# Patient Record
Sex: Female | Born: 1955 | Race: White | Hispanic: No | Marital: Married | Smoking: Current every day smoker
Health system: Southern US, Community
[De-identification: ages and names within clinical notes are randomized; demographics above are authoritative.]

## PROBLEM LIST (undated history)

## (undated) DIAGNOSIS — E7800 Pure hypercholesterolemia, unspecified: Secondary | ICD-10-CM

## (undated) DIAGNOSIS — F32A Depression, unspecified: Secondary | ICD-10-CM

## (undated) DIAGNOSIS — E119 Type 2 diabetes mellitus without complications: Secondary | ICD-10-CM

## (undated) DIAGNOSIS — M797 Fibromyalgia: Secondary | ICD-10-CM

## (undated) DIAGNOSIS — M810 Age-related osteoporosis without current pathological fracture: Secondary | ICD-10-CM

## (undated) DIAGNOSIS — F419 Anxiety disorder, unspecified: Secondary | ICD-10-CM

## (undated) DIAGNOSIS — K219 Gastro-esophageal reflux disease without esophagitis: Secondary | ICD-10-CM

## (undated) DIAGNOSIS — I10 Essential (primary) hypertension: Secondary | ICD-10-CM

## (undated) DIAGNOSIS — K589 Irritable bowel syndrome without diarrhea: Secondary | ICD-10-CM

## (undated) DIAGNOSIS — K2270 Barrett's esophagus without dysplasia: Secondary | ICD-10-CM

## (undated) DIAGNOSIS — F319 Bipolar disorder, unspecified: Secondary | ICD-10-CM

## (undated) DIAGNOSIS — F329 Major depressive disorder, single episode, unspecified: Secondary | ICD-10-CM

## (undated) HISTORY — PX: HERNIA REPAIR: SHX51

## (undated) HISTORY — DX: Anxiety disorder, unspecified: F41.9

## (undated) HISTORY — PX: TOTAL ABDOMINAL HYSTERECTOMY: SHX209

## (undated) HISTORY — PX: CHOLECYSTECTOMY: SHX55

## (undated) HISTORY — DX: Age-related osteoporosis without current pathological fracture: M81.0

## (undated) HISTORY — DX: Barrett's esophagus without dysplasia: K22.70

## (undated) HISTORY — DX: Gastro-esophageal reflux disease without esophagitis: K21.9

---

## 2005-11-22 ENCOUNTER — Ambulatory Visit: Payer: Self-pay | Admitting: Psychiatry

## 2005-11-22 ENCOUNTER — Inpatient Hospital Stay (HOSPITAL_COMMUNITY): Admission: EM | Admit: 2005-11-22 | Discharge: 2005-11-28 | Payer: Self-pay | Admitting: Psychiatry

## 2007-06-20 ENCOUNTER — Ambulatory Visit: Payer: Self-pay | Admitting: *Deleted

## 2007-06-21 ENCOUNTER — Inpatient Hospital Stay (HOSPITAL_COMMUNITY): Admission: AD | Admit: 2007-06-21 | Discharge: 2007-06-28 | Payer: Self-pay | Admitting: *Deleted

## 2010-09-06 NOTE — H&P (Signed)
NAME:  Cassie, Cuevas NO.:  [id number]   MEDICAL RECORD NO.:  [id number]          PATIENT TYPE:  IPS   LOCATION:  0302                          FACILITY:  [facility]   PHYSICIAN:  Jasmine Pang, M.D. DATE OF BIRTH:  [birth year]   DATE OF ADMISSION:  06/21/2007  DATE OF DISCHARGE:                       PSYCHIATRIC ADMISSION ASSESSMENT   HISTORY OF PRESENT ILLNESS:  The patient's chief complaint is a lot of  things have built up.  Her stressors include that she lost her home.  She at the request of her friend, went ahead to her home to stay with  her.  Her friend was giving her about 60 dollars a week for things that  she needed and then the patient was asked to leave after approximately 1  month.  She then went to live with her ex-husband and her daughter.  Her  ex-husband is very ill.  She is now living with her son.  She has begun  to imagine bad things, became suicidal and just wanted to take a bunch  of pills.   PAST PSYCHIATRIC HISTORY:  The patient was here approximately 2 years  ago.  She is a patient of [facility].  She sees Dr.  Letitia Libra.   SOCIAL HISTORY:  She is a 55 year old separated female; still  considered, she states, legally married.  Has been married for 27 years.  Has three adult children.  Her oldest is 50 years of age.  She lives  alone.  She is unemployed.  The patient states that she was living alone  but her current living arrangements are with her son.   Drug history.  The patient smokes.  Denies any alcohol or drug use.   FAMILY HISTORY:  Her mother with depression.  Her father with depression  and her sister has schizophrenia.  Her son has bipolar disorder.   PRIMARY CARE PHYSICIAN:  Dr. Reola Calkins in [place].   PAST MEDICAL HISTORY:  Fibromyalgia, irritable bowel syndrome and non-  insulin-dependent diabetes mellitus.   MEDICATIONS:  The patient lists her meds as Paxil 60 mg daily, Effexor  XR 150 mg daily, Tegretol 100 mg  t.i.d., metformin 1000 mg b.i.d.,  Neurontin 690 mg q.i.d., Ritalin 50 mg at 7 a.m. and 12 noon, Deplin 7.5  mg b.i.d., Vistaril 50 mg t.i.d. p.r.n., Januvia  100 mg daily,  lidocaine patch daily.  The patient gets her medications at [facility].   ALLERGIES:  PENICILLIN.   PHYSICAL EXAMINATION:  The patient was assessed at [facility].  Her physical exam was reviewed.  ER notes state the patient has been  tearful and flat.  She did receive Phenergan and Ativan.  Her  temperature is 97. 2, 90 heart rate, 15 respirations, blood pressure is  111/63.   LABORATORY DATA:  Alcohol level is less than 0.01.  CBC within normal  limits.  Urinalysis is negative.  Urine drug screen was negative.  Urine  pregnancy test was negative.  Her total bili was less than 0.1.   MENTAL STATUS EXAM:  The patient is resting in the bed, middle-aged,  cooperative,  fair eye contact.  She is complaining of some nausea at  this time.  Her speech is clear, normal pace and tone.  Mood is  depressed.  The patient's affect appears flat and sad.  Thought process  endorsing suicidal thoughts but able to contract for safety.  Denies any  hallucinations.  Does not indicate any delusional thinking.  Cognitive  function intact.  Her memory is good.  Judgment and insight is good.  She appears sincere.   DIAGNOSIS:  AXIS I:  Bipolar disorder.  AXIS II:  Deferred.  AXIS III:  Fibromyalgia, irritable bowel syndrome, non-insulin-dependent  diabetes mellitus.  AXIS IV:  Problems with housing, economic issues, medical problems,  possibly other psychosocial problems.  AXIS V:  Current is 35.   PLAN:  Contract for safety.  Stabilize mood and thinking.  Will resume  all her medications.  Case manager will assess her living situation.  The patient is to continue the medication, compliant follow-up at  [facility] for her outpatient services.  Her tentative length of stay is  4-5 days.      Landry Corporal, N.P.       Jasmine Pang, M.D.  Electronically Signed    JO/MEDQ  D:  06/21/2007  T:  06/22/2007  Job:  [id number]

## 2010-09-09 NOTE — Discharge Summary (Signed)
Cassie Cuevas, Cassie Cuevas                ACCOUNT NO.:  [id number]   MEDICAL RECORD NO.:  [id number]          PATIENT TYPE:  IPS   LOCATION:  0506                          FACILITY:  [facility]   PHYSICIAN:  Anselm Jungling, MD  DATE OF BIRTH:  26-May-1955   DATE OF ADMISSION:  11/22/2005  DATE OF DISCHARGE:  11/28/2005                                 DISCHARGE SUMMARY   IDENTIFYING DATA AND REASON FOR ADMISSION:  The patient is a 55 year old  divorced white female admitted due to three weeks of increasing depression  and suicidal ideation.  She had experienced multiple losses and was going  through multiple stressors.  She had recently been placed on a trial of  Cymbalta on an outpatient basis, but reported that she did not feel well on  it.  At the time of admission she denied suicidal ideation but clearly  wanted help and seemed to have difficulty functioning.  Please refer to the  admission note for further details pertaining to the symptoms, circumstances  and history that led to her hospitalization.  She was given an initial Axis  I diagnosis of major depressive disorder recurrent versus adjustment  disorder with depressed mood.   MEDICAL AND LABORATORY:  The patient was medically and physically assessed  by the psychiatric nurse practitioner.  She was continued on metformin 1000  mg b.i.d. for non-insulin-dependent diabetes mellitus.  She was given  Neurontin in doses up to 400 mg t.i.d. to address chronic pain.  She was  continued on lisinopril 20 mg daily for hypertension.  There were no  significant acute medical issues during this brief inpatient psychiatric  stay.   HOSPITAL COURSE:  The patient was admitted to the adult inpatient  psychiatric service.  She presented as a well-nourished, well-developed  adult female of above average intelligence and education.  She was pleasant  and cooperative.  She was not psychotic.  Her mood was depressed with sad  affect, but she denied  suicidal ideation.   Initially, Cymbalta was held, because of the impression that it was not  helpful to the patient.  She was started on a trial of Lamictal 25 mg daily.  Subsequently, it was felt best to restart Cymbalta 30 mg daily.   The patient participated in various therapeutic groups and activities  designed to help her acquire better coping skills, a better understanding of  her underlying disorders and dynamics, and the development of an aftercare  and safety plan.  She was a good participant in the treatment program.   A family session occurred on the sixth hospital day involving the patient  and her adult daughters.  She reported that she was not suicidal and any  longer.  She talked of wanting to improve her relationships with her  daughters.  Her daughters encouraged her to take her medication and stop  trying to control them and their families.  The patient's daughters  indicated their opinion that the patient needed to become more active and  get out of bed.  They all shared their frustrations with each other in  detail.  They reaffirmed their love for one another but discussed the need  to set limits with one another as well.  The patient acknowledged that she  had been extremely irritable recently but apologized for this.  Daughters  stated that they were willing to get the patient to her appointments, but  stated that she needed to communicate with them more.  They discussed  aftercare plans.   The following day, the patient indicated that she felt ready for discharge.  She was less depressed, still absent suicidal ideation.  She felt that she  was benefiting greatly from Neurontin and was tolerating Lamictal.   AFTERCARE:  The patient was to follow-up at [facility] with an  appointment on the day after discharge, that is 11/29/2005.   DISCHARGE MEDICATIONS:  Lisinopril 20 mg daily, metformin 1000 mg b.i.d.,  Neurontin 400 mg q.i.d., Cymbalta 30 mg  daily, and Lamictal 25 mg daily.   DISCHARGE DIAGNOSES:  AXIS I:  Mood disorder not otherwise specified.  AXIS II:  Deferred.  AXIS III:  Non-insulin-dependent diabetes mellitus, neuropathic pain  syndrome, hypertension.  AXIS IV:  Stressors, severe.  AXIS V:  Global Assessment Function on discharge 65.      Anselm Jungling, MD  Electronically Signed     SPB/MEDQ  D:  11/30/2005  T:  11/30/2005  Job:  [id number]
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
## 2011-01-13 LAB — CARBAMAZEPINE LEVEL, TOTAL: Carbamazepine Lvl: 3.2 — ABNORMAL LOW

## 2015-08-17 ENCOUNTER — Emergency Department (HOSPITAL_COMMUNITY)
Admission: EM | Admit: 2015-08-17 | Discharge: 2015-08-17 | Disposition: A | Discharge status: Home / Self Care | Payer: Medicare HMO | Attending: Emergency Medicine | Admitting: Emergency Medicine

## 2015-08-17 ENCOUNTER — Emergency Department (HOSPITAL_COMMUNITY): Payer: Medicare HMO

## 2015-08-17 ENCOUNTER — Encounter (HOSPITAL_COMMUNITY): Payer: Self-pay | Admitting: Emergency Medicine

## 2015-08-17 DIAGNOSIS — R109 Unspecified abdominal pain: Secondary | ICD-10-CM | POA: Diagnosis present

## 2015-08-17 DIAGNOSIS — N281 Cyst of kidney, acquired: Secondary | ICD-10-CM | POA: Diagnosis not present

## 2015-08-17 DIAGNOSIS — Z8719 Personal history of other diseases of the digestive system: Secondary | ICD-10-CM | POA: Insufficient documentation

## 2015-08-17 DIAGNOSIS — I10 Essential (primary) hypertension: Secondary | ICD-10-CM | POA: Insufficient documentation

## 2015-08-17 DIAGNOSIS — Z880 Allergy status to penicillin: Secondary | ICD-10-CM | POA: Diagnosis not present

## 2015-08-17 DIAGNOSIS — E119 Type 2 diabetes mellitus without complications: Secondary | ICD-10-CM | POA: Diagnosis not present

## 2015-08-17 DIAGNOSIS — R1032 Left lower quadrant pain: Secondary | ICD-10-CM

## 2015-08-17 DIAGNOSIS — R112 Nausea with vomiting, unspecified: Secondary | ICD-10-CM | POA: Diagnosis not present

## 2015-08-17 HISTORY — DX: Pure hypercholesterolemia, unspecified: E78.00

## 2015-08-17 HISTORY — DX: Type 2 diabetes mellitus without complications: E11.9

## 2015-08-17 HISTORY — DX: Irritable bowel syndrome, unspecified: K58.9

## 2015-08-17 HISTORY — DX: Essential (primary) hypertension: I10

## 2015-08-17 LAB — CBC
HCT: 40.4 % (ref 36.0–46.0)
Hemoglobin: 13.7 g/dL (ref 12.0–15.0)
MCH: 32.8 pg (ref 26.0–34.0)
MCHC: 33.9 g/dL (ref 30.0–36.0)
MCV: 96.7 fL (ref 78.0–100.0)
PLATELETS: 258 10*3/uL (ref 150–400)
RBC: 4.18 MIL/uL (ref 3.87–5.11)
RDW: 15.1 % (ref 11.5–15.5)
WBC: 13 10*3/uL — ABNORMAL HIGH (ref 4.0–10.5)

## 2015-08-17 LAB — URINALYSIS, ROUTINE W REFLEX MICROSCOPIC
BILIRUBIN URINE: NEGATIVE
Glucose, UA: NEGATIVE mg/dL
HGB URINE DIPSTICK: NEGATIVE
KETONES UR: NEGATIVE mg/dL
Nitrite: NEGATIVE
PROTEIN: NEGATIVE mg/dL
Specific Gravity, Urine: 1.016 (ref 1.005–1.030)
pH: 6 (ref 5.0–8.0)

## 2015-08-17 LAB — URINE MICROSCOPIC-ADD ON: RBC / HPF: NONE SEEN RBC/hpf (ref 0–5)

## 2015-08-17 LAB — COMPREHENSIVE METABOLIC PANEL
ALK PHOS: 82 U/L (ref 38–126)
ALT: 20 U/L (ref 14–54)
AST: 28 U/L (ref 15–41)
Albumin: 4 g/dL (ref 3.5–5.0)
Anion gap: 12 (ref 5–15)
BUN: 12 mg/dL (ref 6–20)
CALCIUM: 9.4 mg/dL (ref 8.9–10.3)
CO2: 23 mmol/L (ref 22–32)
CREATININE: 0.78 mg/dL (ref 0.44–1.00)
Chloride: 101 mmol/L (ref 101–111)
Glucose, Bld: 241 mg/dL — ABNORMAL HIGH (ref 65–99)
Potassium: 4.2 mmol/L (ref 3.5–5.1)
SODIUM: 136 mmol/L (ref 135–145)
Total Bilirubin: 0.5 mg/dL (ref 0.3–1.2)
Total Protein: 7.4 g/dL (ref 6.5–8.1)

## 2015-08-17 LAB — I-STAT CG4 LACTIC ACID, ED: Lactic Acid, Venous: 1 mmol/L (ref 0.5–2.0)

## 2015-08-17 LAB — CBG MONITORING, ED: GLUCOSE-CAPILLARY: 180 mg/dL — AB (ref 65–99)

## 2015-08-17 LAB — LIPASE, BLOOD: Lipase: 36 U/L (ref 11–51)

## 2015-08-17 MED ORDER — HYDROMORPHONE HCL 1 MG/ML IJ SOLN
1.0000 mg | Freq: Once | INTRAMUSCULAR | Status: AC
  Administered 2015-08-17: 1 mg via INTRAVENOUS
  Filled 2015-08-17: qty 1

## 2015-08-17 MED ORDER — ONDANSETRON 4 MG PO TBDP
ORAL_TABLET | ORAL | Status: AC
  Filled 2015-08-17: qty 1

## 2015-08-17 MED ORDER — ONDANSETRON 4 MG PO TBDP: 4.0000 mg | ORAL_TABLET | Freq: Three times a day (TID) | ORAL | Status: DC | PRN

## 2015-08-17 MED ORDER — ONDANSETRON HCL 4 MG/2ML IJ SOLN
4.0000 mg | Freq: Once | INTRAMUSCULAR | Status: AC
  Administered 2015-08-17: 4 mg via INTRAVENOUS
  Filled 2015-08-17: qty 2

## 2015-08-17 MED ORDER — IOPAMIDOL (ISOVUE-300) INJECTION 61%
INTRAVENOUS | Status: AC
  Administered 2015-08-17: 100 mL
  Filled 2015-08-17: qty 100

## 2015-08-17 MED ORDER — SODIUM CHLORIDE 0.9 % IV BOLUS (SEPSIS)
1000.0000 mL | Freq: Once | INTRAVENOUS | Status: AC
  Administered 2015-08-17: 1000 mL via INTRAVENOUS

## 2015-08-17 MED ORDER — NAPROXEN 375 MG PO TABS: 375.0000 mg | ORAL_TABLET | Freq: Two times a day (BID) | ORAL | Status: DC | PRN

## 2015-08-17 MED ORDER — ONDANSETRON 4 MG PO TBDP: 4.0000 mg | ORAL_TABLET | Freq: Once | ORAL | Status: DC | PRN

## 2015-08-17 NOTE — ED Notes (Signed)
Back from CT

## 2015-08-17 NOTE — ED Provider Notes (Signed)
CSN: [id number]     Arrival date & time 08/17/15  1456 History   First MD Initiated Contact with Patient 08/17/15 1953     Chief Complaint  Patient presents with  . Abdominal Pain  . Emesis     (Consider location/radiation/quality/duration/timing/severity/associated sxs/prior Treatment) HPI   60 year old female with past medical history of hypertension, diabetes, hyperlipidemia, irritable bowel syndrome, and history of left inguinal hernia repair who presents with acute on chronic left lower quadrant abdominal pain and left inguinal pain. The patient states that when she first had her inguinal hernia repaired last year she had severe left inguinal pain and required rehospitalization 2 days later due to inability to tolerate by mouth. She has had intermittent pain since then. Over the last 2 weeks, she has had increasingly severe left inguinal pain as well as left lower quadrant and left flank pain. The pain is constant but made worse with eating as well as any palpation or movement. The pain has become so severe that she's been unable tolerate her by mouth intake. She denies any associated fevers. She denies any dysuria or urinary frequency. Denies any vaginal bleeding or discharge. Denies any alleviating factors.  Past Medical History  Diagnosis Date  . Diabetes mellitus without complication ([place])   . Hypertension   . IBS (irritable bowel syndrome)   . Hypercholesterolemia    History reviewed. No pertinent past surgical history. History reviewed. No pertinent family history. Social History  Substance Use Topics  . Smoking status: Current Every Day Smoker -- 1.00 packs/day    Types: Cigarettes  . Smokeless tobacco: None  . Alcohol Use: No   OB History    No data available     Review of Systems  Constitutional: Positive for fatigue. Negative for fever and chills.  HENT: Negative for congestion and rhinorrhea.   Eyes: Negative for visual disturbance.  Respiratory: Negative for  cough, shortness of breath and wheezing.   Cardiovascular: Negative for chest pain and leg swelling.  Gastrointestinal: Positive for nausea, vomiting, abdominal pain and diarrhea.  Genitourinary: Negative for dysuria and flank pain.  Musculoskeletal: Negative for neck pain.  Skin: Negative for rash.  Neurological: Negative for syncope, weakness and headaches.      Allergies  Penicillins and Tape  Home Medications   Prior to Admission medications   Not on File   BP 118/78 mmHg  Pulse 100  Temp(Src) 97.9 F (36.6 C) (Oral)  Resp 18  SpO2 99% Physical Exam  Constitutional: She is oriented to person, place, and time. She appears well-developed and well-nourished. No distress.  HENT:  Head: Normocephalic.  Mouth/Throat: No oropharyngeal exudate.  Eyes: Conjunctivae are normal. Pupils are equal, round, and reactive to light.  Neck: Normal range of motion. Neck supple.  Cardiovascular: Normal rate, regular rhythm and intact distal pulses.  Exam reveals no friction rub.   No murmur heard. Pulmonary/Chest: Effort normal and breath sounds normal. No respiratory distress. She has no wheezes. She has no rales.  Abdominal: Soft. She exhibits no distension. There is tenderness. There is no rebound and no guarding.  Surgical sites to periumbilical and left abdominal sites clean dry and intact and well-healed. Moderate tenderness in the left inguinal area with no appreciable hernia. Bowel sounds are normal. She has mild tenderness along the left lower quadrant extending into the left flank as well. No CVA tenderness.  Musculoskeletal: She exhibits no edema.  Neurological: She is alert and oriented to person, place, and time. She exhibits normal  muscle tone.  Skin: Skin is warm. No rash noted.  Nursing note and vitals reviewed.   ED Course  Procedures (including critical care time) Labs Review Labs Reviewed  COMPREHENSIVE METABOLIC PANEL - Abnormal; Notable for the following:     Glucose, Bld 241 (*)    All other components within normal limits  CBC - Abnormal; Notable for the following:    WBC 13.0 (*)    All other components within normal limits  CBG MONITORING, ED - Abnormal; Notable for the following:    Glucose-Capillary 180 (*)    All other components within normal limits  LIPASE, BLOOD  URINALYSIS, ROUTINE W REFLEX MICROSCOPIC (NOT AT [facility])    Imaging Review No results found. I have personally reviewed and evaluated these images and lab results as part of my medical decision-making.   EKG Interpretation None      MDM   60 yo F with PMHx of HTN, HLD, IBS, h/o left inguinal hernia repair who p/w acute on chronic left inguinal pain. VSS and WNL, exam reassuring as above. Initial DDx includes post-op complication, fat herniation, diverticulitis, colitis. No urinary frequency, dysuria, vaginal bleeding, discharge, or other signs of GU etiology. No skin rash or signs of cellulitis. Will check labs, CT, give pain control. She is otherwise well-appearing with no signs of peritonitis, obstruction, or other complication.  Labs and imaging reviewed as above. CBC shows mild leukocytosis. CMP is at baseline with mild hyperglycemia but no anion gap. Urinalysis is unremarkable. Lactic acid is normal and reassuring. CT is pending. Pain is improved with IV fluids and analgesia.  CT scan shows no acute abnormality. Patient remains hemodynamically stable. Pain is improved and she is tolerating by mouth. Will discharge with antibiotics and outpatient follow-up. Patient is in agreement with this plan. She has pain medications at home.  Clinical Impression: 1. Abdominal pain, LLQ   2. Renal cyst, right   3. Non-intractable vomiting with nausea, vomiting of unspecified type     Disposition: Discharge  Condition: Good  I have discussed the results, Dx and Tx plan with the pt(& family if present). He/she/they expressed understanding and agree(s) with the plan. Discharge  instructions discussed at great length. Strict return precautions discussed and pt &/or family have verbalized understanding of the instructions. No further questions at time of discharge.    New Prescriptions   NAPROXEN (NAPROSYN) 375 MG TABLET    Take 1 tablet (375 mg total) by mouth 2 (two) times daily as needed for moderate pain.   ONDANSETRON (ZOFRAN ODT) 4 MG DISINTEGRATING TABLET    Take 1 tablet (4 mg total) by mouth every 8 (eight) hours as needed for nausea or vomiting.    Follow Up: [place] [address]  [place] [address] [phone]   Follow-up as needed  [address] [place] [address] [phone]   Call this number to set up an appointment with one of our surgeons to discuss your postoperative pain.   Pt seen in conjunction with Dr. Everitt Amber, MD 08/17/15 2351  Ezequiel Essex, MD 08/18/15 0001

## 2015-08-17 NOTE — ED Notes (Signed)
Pt reports hx of hernia. Reports increased abdominal pain and vomiting over the last week. Denies recent fever.

## 2015-10-23 ENCOUNTER — Emergency Department (HOSPITAL_COMMUNITY): Payer: Medicare HMO

## 2015-10-23 ENCOUNTER — Emergency Department (HOSPITAL_COMMUNITY)
Admission: EM | Admit: 2015-10-23 | Discharge: 2015-10-23 | Disposition: A | Discharge status: Home / Self Care | Payer: Medicare HMO | Attending: Emergency Medicine | Admitting: Emergency Medicine

## 2015-10-23 ENCOUNTER — Encounter (HOSPITAL_COMMUNITY): Payer: Self-pay

## 2015-10-23 DIAGNOSIS — I10 Essential (primary) hypertension: Secondary | ICD-10-CM | POA: Insufficient documentation

## 2015-10-23 DIAGNOSIS — R531 Weakness: Secondary | ICD-10-CM | POA: Diagnosis present

## 2015-10-23 DIAGNOSIS — F17210 Nicotine dependence, cigarettes, uncomplicated: Secondary | ICD-10-CM | POA: Diagnosis not present

## 2015-10-23 DIAGNOSIS — E119 Type 2 diabetes mellitus without complications: Secondary | ICD-10-CM | POA: Insufficient documentation

## 2015-10-23 DIAGNOSIS — R4781 Slurred speech: Secondary | ICD-10-CM | POA: Diagnosis not present

## 2015-10-23 DIAGNOSIS — Z794 Long term (current) use of insulin: Secondary | ICD-10-CM | POA: Diagnosis not present

## 2015-10-23 DIAGNOSIS — Z79899 Other long term (current) drug therapy: Secondary | ICD-10-CM | POA: Diagnosis not present

## 2015-10-23 DIAGNOSIS — I639 Cerebral infarction, unspecified: Secondary | ICD-10-CM

## 2015-10-23 LAB — CBC WITH DIFFERENTIAL/PLATELET
BASOS ABS: 0 10*3/uL (ref 0.0–0.1)
BASOS PCT: 0 %
EOS ABS: 0.2 10*3/uL (ref 0.0–0.7)
Eosinophils Relative: 2 %
HEMATOCRIT: 36.4 % (ref 36.0–46.0)
HEMOGLOBIN: 12.3 g/dL (ref 12.0–15.0)
Lymphocytes Relative: 28 %
Lymphs Abs: 2.3 10*3/uL (ref 0.7–4.0)
MCH: 33 pg (ref 26.0–34.0)
MCHC: 33.8 g/dL (ref 30.0–36.0)
MCV: 97.6 fL (ref 78.0–100.0)
Monocytes Absolute: 0.5 10*3/uL (ref 0.1–1.0)
Monocytes Relative: 6 %
NEUTROS ABS: 5.3 10*3/uL (ref 1.7–7.7)
NEUTROS PCT: 64 %
Platelets: 220 10*3/uL (ref 150–400)
RBC: 3.73 MIL/uL — ABNORMAL LOW (ref 3.87–5.11)
RDW: 14.2 % (ref 11.5–15.5)
WBC: 8.4 10*3/uL (ref 4.0–10.5)

## 2015-10-23 LAB — BASIC METABOLIC PANEL
ANION GAP: 9 (ref 5–15)
BUN: 20 mg/dL (ref 6–20)
CALCIUM: 9.3 mg/dL (ref 8.9–10.3)
CO2: 26 mmol/L (ref 22–32)
Chloride: 103 mmol/L (ref 101–111)
Creatinine, Ser: 1.03 mg/dL — ABNORMAL HIGH (ref 0.44–1.00)
GFR, EST NON AFRICAN AMERICAN: 59 mL/min — AB (ref >60)
Glucose, Bld: 271 mg/dL — ABNORMAL HIGH (ref 65–99)
Potassium: 4.2 mmol/L (ref 3.5–5.1)
SODIUM: 138 mmol/L (ref 135–145)

## 2015-10-23 LAB — URINALYSIS, ROUTINE W REFLEX MICROSCOPIC
Bilirubin Urine: NEGATIVE
Glucose, UA: 100 mg/dL — AB
Hgb urine dipstick: NEGATIVE
KETONES UR: NEGATIVE mg/dL
LEUKOCYTES UA: NEGATIVE
NITRITE: NEGATIVE
PH: 5 (ref 5.0–8.0)
Protein, ur: NEGATIVE mg/dL
SPECIFIC GRAVITY, URINE: 1.02 (ref 1.005–1.030)

## 2015-10-23 LAB — RAPID URINE DRUG SCREEN, HOSP PERFORMED
Amphetamines: NOT DETECTED
BARBITURATES: NOT DETECTED
BENZODIAZEPINES: POSITIVE — AB
COCAINE: NOT DETECTED
OPIATES: NOT DETECTED
TETRAHYDROCANNABINOL: NOT DETECTED

## 2015-10-23 MED ORDER — SODIUM CHLORIDE 0.9 % IV BOLUS (SEPSIS)
1000.0000 mL | Freq: Once | INTRAVENOUS | Status: AC
  Administered 2015-10-23: 1000 mL via INTRAVENOUS

## 2015-10-23 NOTE — ED Notes (Signed)
Pt transported to MRI 

## 2015-10-23 NOTE — ED Notes (Signed)
Per EMS - pt from home. Pt woke up around 0900 and c/o right arm weakness/tremor. Pt still has slight right arm weakness. Able to follow commands, ambulatory, no facial droop, equal grip strength. CBG 304. Pt took 10mg  valium at 0930.

## 2015-10-23 NOTE — ED Notes (Signed)
Pt ambulatory w/ steady gait to restroom. 

## 2015-10-23 NOTE — Discharge Instructions (Signed)

## 2015-10-23 NOTE — ED Notes (Signed)
[place] for pt to have PO food/fluids per neurologist. Pt given sandwich and water after passing swallow screen.

## 2015-10-23 NOTE — ED Provider Notes (Signed)
Care transferred from [facility].  Pt presents with RUE weakness over the past day.  Seen by neurology and recommend MRI.    MRI found to be negative.  Per checkout pt stable for discharge with f/u with PCP and neurology.  Pt reevaluated and agree with stability for discharge home.   Pt care supervised by my attending Dr. Kathrynn Humble.   Geronimo Boot, MD PGY-3  Emergency Medicine   Geronimo Boot, MD 10/23/15 [phone]

## 2015-10-23 NOTE — Consult Note (Signed)
NEURO HOSPITALIST CONSULT NOTE   Requestig physician: ER   Reason for Consult:Right arm numbness and weakness   History obtained from:  Patient     HPI:                                                                                                                                          Cassie Cuevas is an 60 y.o. female who woke up this am at 5 am normal and went back to sleep on her recliner.  She woke up at 9 am with right arm weakness and numbness.  No face or leg involvement.  She does take Clonazepam for anxiety but has not taken any since last night at 8 pm.  CT brain is normal.  Past Medical History  Diagnosis Date  . Diabetes mellitus without complication ([place])   . Hypertension   . IBS (irritable bowel syndrome)   . Hypercholesterolemia     History reviewed. No pertinent past surgical history.  History reviewed. No pertinent family history.  Social History:  reports that she has been smoking Cigarettes.  She has been smoking about 1.00 pack per day. She does not have any smokeless tobacco history on file. She reports that she does not drink alcohol or use illicit drugs.  Allergies  Allergen Reactions  . Penicillins     Vomiting   . Tape     Rash, needs paper tape     MEDICATIONS:                                                                                                                     Clonazepam qd..   ROS:  History obtained from the patient  General ROS: negative for - chills, fatigue, fever, night sweats, weight gain or weight loss Psychological ROS: negative for - behavioral disorder, hallucinations, memory difficulties, mood swings or suicidal ideation Ophthalmic ROS: negative for - blurry vision, double vision, eye pain or loss of vision ENT ROS: negative for - epistaxis, nasal discharge,  oral lesions, sore throat, tinnitus or vertigo Allergy and Immunology ROS: negative for - hives or itchy/watery eyes Hematological and Lymphatic ROS: negative for - bleeding problems, bruising or swollen lymph nodes Endocrine ROS: negative for - galactorrhea, hair pattern changes, polydipsia/polyuria or temperature intolerance Respiratory ROS: negative for - cough, hemoptysis, shortness of breath or wheezing Cardiovascular ROS: negative for - chest pain, dyspnea on exertion, edema or irregular heartbeat Gastrointestinal ROS: negative for - abdominal pain, diarrhea, hematemesis, nausea/vomiting or stool incontinence Genito-Urinary ROS: negative for - dysuria, hematuria, incontinence or urinary frequency/urgency Musculoskeletal ROS: negative for - joint swelling or muscular weakness Neurological ROS: as noted in HPI Dermatological ROS: negative for rash and skin lesion changes   Blood pressure 122/86, pulse 85, temperature 98.1 F (36.7 C), temperature source Oral, resp. rate 17, height 5\' 3"  (1.6 m), weight 66.225 kg (146 lb), SpO2 98 %.   Neurologic Examination:                                                                                                      HEENT-  Normocephalic, no lesions, without obvious abnormality.  Normal external eye and conjunctiva.  Normal TM's bilaterally.  Normal auditory canals and external ears. Normal external nose, mucus membranes and septum.  Normal pharynx. Cardiovascular- regular rate and rhythm, S1, S2 normal, no murmur, click, rub or gallop, pulses palpable throughout   Lungs- chest clear, no wheezing, rales, normal symmetric air entry, Heart exam - S1, S2 normal, no murmur, no gallop, rate regular Abdomen- soft, non-tender; bowel sounds normal; no masses,  no organomegaly Extremities- no edema Lymph-no adenopathy palpable Musculoskeletal-no joint tenderness, deformity or swelling Skin-dry  Neurological Examination Mental Status: Alert, oriented,  thought content appropriate.  Speech fluent without evidence of aphasia.  Able to follow 3 step commands without difficulty. Cranial Nerves: II: Discs flat bilaterally; Visual fields grossly normal, pupils equal, round, reactive to light and accommodation III,IV, VI: ptosis not present, extra-ocular motions intact bilaterally V,VII: smile symmetric, facial light touch sensation normal bilaterally VIII: hearing normal bilaterally IX,X: uvula rises symmetrically XI: bilateral shoulder shrug XII: midline tongue extension Motor: Right : Upper extremity   2/5 wrist extension and finger extension; Ulnar and Median nerve territory is normal   Left:     Upper extremity   5/5  Lower extremity   5/5     Lower extremity   5/5 Tone and bulk:normal tone throughout; no atrophy noted Sensory: Pinprick and light touch intact throughout, bilaterally, except reduced in the right dorsal forearm and lateral dorsal hand Deep Tendon Reflexes: 2+ and symmetric throughout Plantars: Right: downgoing   Left: downgoing Cerebellar: normal finger-to-nose, normal rapid alternating movements and normal heel-to-shin test Gait: normal gait and station  Lab Results: Basic Metabolic Panel:  Recent Labs Lab 10/23/15 1136  NA 138  K 4.2  CL 103  CO2 26  GLUCOSE 271*  BUN 20  CREATININE 1.03*  CALCIUM 9.3    Liver Function Tests: No results for input(s): AST, ALT, ALKPHOS, BILITOT, PROT, ALBUMIN in the last 168 hours. No results for input(s): LIPASE, AMYLASE in the last 168 hours. No results for input(s): AMMONIA in the last 168 hours.  CBC:  Recent Labs Lab 10/23/15 1136  WBC 8.4  NEUTROABS 5.3  HGB 12.3  HCT 36.4  MCV 97.6  PLT 220    Cardiac Enzymes: No results for input(s): CKTOTAL, CKMB, CKMBINDEX, TROPONINI in the last 168 hours.  Lipid Panel: No results for input(s): CHOL, TRIG, HDL, CHOLHDL, VLDL, LDLCALC in the last 168 hours.  CBG: No results for input(s): GLUCAP in the last  168 hours.  Microbiology: No results found for this or any previous visit.  Coagulation Studies: No results for input(s): LABPROT, INR in the last 72 hours.  Imaging: Ct Head Wo Contrast  10/23/2015  CLINICAL DATA:  60 year old female with right arm weakness and ataxia EXAM: CT HEAD WITHOUT CONTRAST TECHNIQUE: Contiguous axial images were obtained from the base of the skull through the vertex without intravenous contrast. COMPARISON:  None. FINDINGS: Negative for acute intracranial hemorrhage, acute infarction, mass, mass effect, hydrocephalus or midline shift. Gray-white differentiation is preserved throughout. No acute soft tissue or calvarial abnormality. The globes and orbits are symmetric and unremarkable. Normal aeration of the mastoid air cells and visualized paranasal sinuses. IMPRESSION: Negative head CT. Electronically Signed   By: Jacqulynn Cadet M.D.   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  Since she fell asleep on the recliner, it is a set up for that possibly.  This is usually demyelinating and corrects itself over the next few days.   If not better by 3 weeks, then an EMG/NCS needs to be done.  I cannot completely rule out a subcortical left hemisphere infarct which can give isolated right arm weakness.  Therefore, she should have an MRI Brain to rule that out before discharge.  No need to admit unless infarct is present, at which point risk factor assessment and modification needs to be done.

## 2015-10-23 NOTE — ED Notes (Signed)
Dr. Canary Brim and [place], [place] aware of pt presentation of right arm weakness, decreased sensory in right leg, and ataxia in right arm. Uncertain as to LKW because pt woke up with symptoms.

## 2015-10-23 NOTE — ED Notes (Signed)
Neurologist at bedside. 

## 2015-10-23 NOTE — ED Notes (Signed)
Dinner tray ordered for patient, regular diet

## 2015-10-23 NOTE — ED Notes (Signed)
Pt transported to CT ?

## 2015-10-23 NOTE — ED Provider Notes (Signed)
CSN: [id number]     Arrival date & time 10/23/15  1104 History   First MD Initiated Contact with Patient 10/23/15 1113     Chief Complaint  Patient presents with  . Extremity Weakness     (Consider location/radiation/quality/duration/timing/severity/associated sxs/prior Treatment) HPI Cassie Cuevas is a 61 y.o. female history of diabetes, hypertension, hypercholesterolemia, presents for evaluation of right arm weakness. Patient reports she woke up this morning at approximately 9:00 AM and felt like her right arm was more weak than normal. She is very drowsy upon my evaluation, falls asleep, but awakens to stimuli. Reports she took 10 mg of Valium this morning which is normal for her. Denies any associated headache, vision changes, difficulty speaking, facial droop, chest pain or shortness of breath.  Past Medical History  Diagnosis Date  . Diabetes mellitus without complication ([place])   . Hypertension   . IBS (irritable bowel syndrome)   . Hypercholesterolemia    History reviewed. No pertinent past surgical history. History reviewed. No pertinent family history. Social History  Substance Use Topics  . Smoking status: Current Every Day Smoker -- 1.00 packs/day    Types: Cigarettes  . Smokeless tobacco: None  . Alcohol Use: No   OB History    No data available     Review of Systems A 10 point review of systems was completed and was negative except for pertinent positives and negatives as mentioned in the history of present illness     Allergies  Penicillins and Tape  Home Medications   Prior to Admission medications   Medication Sig Start Date End Date Taking? Authorizing Provider  ARIPiprazole (ABILIFY) 10 MG tablet Take 1 tablet by mouth daily. 07/27/15   Historical Provider, MD  buPROPion (WELLBUTRIN SR) 100 MG 12 hr tablet Take 1 tablet by mouth 2 (two) times daily. 07/15/15   Historical Provider, MD  diazepam (VALIUM) 10 MG tablet Take 1 tablet by mouth daily. 07/28/15    Historical Provider, MD  dicyclomine (BENTYL) 10 MG capsule Take 1 capsule by mouth 3 (three) times daily. 08/13/15   Historical Provider, MD  esomeprazole (NEXIUM) 40 MG capsule Take 1 capsule by mouth daily. 08/13/15   Historical Provider, MD  fluticasone (FLONASE) 50 MCG/ACT nasal spray Place 2 sprays into both nostrils as needed. For allergies 08/13/15   Historical Provider, MD  gabapentin (NEURONTIN) 800 MG tablet Take 1 tablet by mouth 2 (two) times daily. Take one tablet by mouth twice daily and take two tablets at bed time 07/26/15   Historical Provider, MD  HYDROcodone-acetaminophen (NORCO) 7.5-325 MG tablet Take 1 tablet by mouth 2 (two) times daily. 07/28/15   Historical Provider, MD  JANUVIA 100 MG tablet Take 1 tablet by mouth daily. 07/26/15   Historical Provider, MD  LEVEMIR FLEXTOUCH 100 UNIT/ML Pen Inject 80 Units into the skin every evening. 07/26/15   Historical Provider, MD  losartan (COZAAR) 100 MG tablet Take 1 tablet by mouth daily. 07/28/15   Historical Provider, MD  metFORMIN (GLUCOPHAGE) 500 MG tablet Take 5 tablets by mouth daily. Take  3 tablets by mouth each morning and 2 tablets by mouth each evening. 06/14/15   Historical Provider, MD  naproxen (NAPROSYN) 375 MG tablet Take 1 tablet (375 mg total) by mouth 2 (two) times daily as needed for moderate pain. 08/17/15   Duffy Bruce, MD  NOVOLOG FLEXPEN 100 UNIT/ML FlexPen Inject 20 Units into the skin 2 (two) times daily before a meal. 07/26/15   Historical Provider, MD  ondansetron (ZOFRAN ODT) 4 MG disintegrating tablet Take 1 tablet (4 mg total) by mouth every 8 (eight) hours as needed for nausea or vomiting. 08/17/15   Duffy Bruce, MD  oxybutynin (DITROPAN) 5 MG tablet Take 1 tablet by mouth as needed. For overactive bladder 06/15/15   Historical Provider, MD  simvastatin (ZOCOR) 40 MG tablet Take 1 tablet by mouth daily. 07/27/15   Historical Provider, MD  traMADol (ULTRAM) 50 MG tablet Take 1 tablet by mouth as needed. For  fibromyalgia pain 07/28/15   Historical Provider, MD  VENTOLIN HFA 108 (90 Base) MCG/ACT inhaler Inhale 2 puffs into the lungs as needed. wheezing 08/09/15   Historical Provider, MD  zolpidem (AMBIEN) 10 MG tablet Take 1 tablet by mouth at bedtime. 07/02/15   Historical Provider, MD   BP 107/63 mmHg  Pulse 93  Temp(Src) 98.1 F (36.7 C) (Oral)  Resp 16  Ht 5\' 3"  (1.6 m)  Wt 66.225 kg  BMI 25.87 kg/m2  SpO2 95% Physical Exam  Constitutional: She is oriented to person, place, and time. She appears well-developed and well-nourished.  Caucasian female, no apparent distress. GCS 15. She is drowsy on my exam, frequent falls asleep, but awakens quickly to sternal rub.  HENT:  Head: Normocephalic and atraumatic.  Mouth/Throat: Oropharynx is clear and moist.  Eyes: Conjunctivae are normal. Pupils are equal, round, and reactive to light. Right eye exhibits no discharge. Left eye exhibits no discharge. No scleral icterus.  Neck: Neck supple.  Cardiovascular: Normal rate, regular rhythm and normal heart sounds.   Pulmonary/Chest: Effort normal and breath sounds normal. No respiratory distress. She has no wheezes. She has no rales.  Abdominal: Soft. There is no tenderness.  Musculoskeletal: She exhibits no tenderness.  Neurological: She is alert and oriented to person, place, and time.  Cranial Nerves II-XII grossly intact  Skin: Skin is warm and dry. No rash noted.  Psychiatric: She has a normal mood and affect.  Nursing note and vitals reviewed.   ED Course  Procedures (including critical care time) Labs Review Labs Reviewed  BASIC METABOLIC PANEL  CBC WITH DIFFERENTIAL/PLATELET  URINALYSIS, ROUTINE W REFLEX MICROSCOPIC (NOT AT [facility])    Imaging Review No results found. I have personally reviewed and evaluated these images and lab results as part of my medical decision-making.   EKG Interpretation   Date/Time:  Saturday October 23 2015 11:10:13 EDT Ventricular Rate:  93 PR Interval:     QRS Duration: 97 QT Interval:  394 QTC Calculation: 491 R Axis:   36 Text Interpretation:  Sinus rhythm Nonspecific T abnormalities, anterior  leads Borderline prolonged QT interval No significant change since last  tracing Confirmed by [facility]  MD, MARTHA [phone]) on 10/23/2015 11:14:59 AM      MDM  Patient with right arm weakness upon waking this morning at 9:00 AM, last known normal unknown. Arrival, she is very tired, admits to taking 10 mg of Valium this morning, which she states is typical for her. She does not provide good effort on exam. CT head and screening labs are negative for any emergent pathology. Plan to consult neurology, discussed with Dr. Orlena Sheldon, will see in ED. Patient care signed out to oncoming provider, Dr. Alcide Evener, for follow-up on MR brain as recommended by neurology. If negative, patient may be discharged to follow-up outpatient with neurology. Disposition accordingly. Final diagnoses:  Weakness        Comer Locket, PA-C 10/24/15 [place], MD 10/25/15 [phone]

## 2015-10-23 NOTE — ED Notes (Signed)
Pt verbalized understanding of d/c instructions and follow-up care. No further questions/concerns, VSS, ambulatory w/ steady gait (refused wheelchair) 

## 2016-10-30 DIAGNOSIS — E785 Hyperlipidemia, unspecified: Secondary | ICD-10-CM

## 2016-10-30 DIAGNOSIS — T424X1A Poisoning by benzodiazepines, accidental (unintentional), initial encounter: Secondary | ICD-10-CM

## 2016-10-30 DIAGNOSIS — E876 Hypokalemia: Secondary | ICD-10-CM

## 2016-10-30 DIAGNOSIS — I10 Essential (primary) hypertension: Secondary | ICD-10-CM

## 2016-10-30 DIAGNOSIS — F329 Major depressive disorder, single episode, unspecified: Secondary | ICD-10-CM

## 2016-10-30 DIAGNOSIS — J690 Pneumonitis due to inhalation of food and vomit: Secondary | ICD-10-CM

## 2016-10-30 DIAGNOSIS — E119 Type 2 diabetes mellitus without complications: Secondary | ICD-10-CM

## 2016-10-31 DIAGNOSIS — J690 Pneumonitis due to inhalation of food and vomit: Secondary | ICD-10-CM | POA: Diagnosis not present

## 2016-11-01 DIAGNOSIS — J690 Pneumonitis due to inhalation of food and vomit: Secondary | ICD-10-CM

## 2016-11-02 DIAGNOSIS — J690 Pneumonitis due to inhalation of food and vomit: Secondary | ICD-10-CM

## 2016-11-03 DIAGNOSIS — J690 Pneumonitis due to inhalation of food and vomit: Secondary | ICD-10-CM

## 2016-11-04 DIAGNOSIS — J690 Pneumonitis due to inhalation of food and vomit: Secondary | ICD-10-CM

## 2016-11-05 DIAGNOSIS — J690 Pneumonitis due to inhalation of food and vomit: Secondary | ICD-10-CM

## 2016-11-06 DIAGNOSIS — J690 Pneumonitis due to inhalation of food and vomit: Secondary | ICD-10-CM

## 2016-11-08 DIAGNOSIS — T1491XA Suicide attempt, initial encounter: Secondary | ICD-10-CM

## 2016-11-08 DIAGNOSIS — J9690 Respiratory failure, unspecified, unspecified whether with hypoxia or hypercapnia: Secondary | ICD-10-CM
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
## 2017-02-22 ENCOUNTER — Inpatient Hospital Stay (HOSPITAL_COMMUNITY)
Admission: AD | Admit: 2017-02-22 | Discharge: 2017-02-25 | DRG: 885 | Disposition: A | Discharge status: Other Acute Inpatient Hospital | Payer: Medicare PPO | Source: Intra-hospital | Attending: Internal Medicine | Admitting: Internal Medicine

## 2017-02-22 ENCOUNTER — Encounter (HOSPITAL_COMMUNITY): Payer: Self-pay

## 2017-02-22 ENCOUNTER — Inpatient Hospital Stay (HOSPITAL_COMMUNITY): Payer: Medicare PPO

## 2017-02-22 DIAGNOSIS — Z794 Long term (current) use of insulin: Secondary | ICD-10-CM | POA: Diagnosis not present

## 2017-02-22 DIAGNOSIS — R109 Unspecified abdominal pain: Secondary | ICD-10-CM | POA: Diagnosis not present

## 2017-02-22 DIAGNOSIS — G629 Polyneuropathy, unspecified: Secondary | ICD-10-CM | POA: Diagnosis not present

## 2017-02-22 DIAGNOSIS — G4700 Insomnia, unspecified: Secondary | ICD-10-CM | POA: Diagnosis present

## 2017-02-22 DIAGNOSIS — Z915 Personal history of self-harm: Secondary | ICD-10-CM

## 2017-02-22 DIAGNOSIS — Z818 Family history of other mental and behavioral disorders: Secondary | ICD-10-CM | POA: Diagnosis not present

## 2017-02-22 DIAGNOSIS — E1143 Type 2 diabetes mellitus with diabetic autonomic (poly)neuropathy: Secondary | ICD-10-CM | POA: Diagnosis present

## 2017-02-22 DIAGNOSIS — N281 Cyst of kidney, acquired: Secondary | ICD-10-CM | POA: Diagnosis not present

## 2017-02-22 DIAGNOSIS — Z8719 Personal history of other diseases of the digestive system: Secondary | ICD-10-CM | POA: Diagnosis not present

## 2017-02-22 DIAGNOSIS — Z91048 Other nonmedicinal substance allergy status: Secondary | ICD-10-CM

## 2017-02-22 DIAGNOSIS — Z9049 Acquired absence of other specified parts of digestive tract: Secondary | ICD-10-CM | POA: Diagnosis not present

## 2017-02-22 DIAGNOSIS — F419 Anxiety disorder, unspecified: Secondary | ICD-10-CM | POA: Diagnosis present

## 2017-02-22 DIAGNOSIS — E1149 Type 2 diabetes mellitus with other diabetic neurological complication: Secondary | ICD-10-CM | POA: Diagnosis not present

## 2017-02-22 DIAGNOSIS — E785 Hyperlipidemia, unspecified: Secondary | ICD-10-CM | POA: Diagnosis not present

## 2017-02-22 DIAGNOSIS — R4584 Anhedonia: Secondary | ICD-10-CM | POA: Diagnosis not present

## 2017-02-22 DIAGNOSIS — Z79899 Other long term (current) drug therapy: Secondary | ICD-10-CM | POA: Diagnosis not present

## 2017-02-22 DIAGNOSIS — K580 Irritable bowel syndrome with diarrhea: Secondary | ICD-10-CM | POA: Diagnosis not present

## 2017-02-22 DIAGNOSIS — Z811 Family history of alcohol abuse and dependence: Secondary | ICD-10-CM | POA: Diagnosis not present

## 2017-02-22 DIAGNOSIS — E7800 Pure hypercholesterolemia, unspecified: Secondary | ICD-10-CM | POA: Diagnosis not present

## 2017-02-22 DIAGNOSIS — K589 Irritable bowel syndrome without diarrhea: Secondary | ICD-10-CM | POA: Diagnosis not present

## 2017-02-22 DIAGNOSIS — R110 Nausea: Secondary | ICD-10-CM

## 2017-02-22 DIAGNOSIS — M797 Fibromyalgia: Secondary | ICD-10-CM | POA: Diagnosis present

## 2017-02-22 DIAGNOSIS — Z7951 Long term (current) use of inhaled steroids: Secondary | ICD-10-CM | POA: Diagnosis not present

## 2017-02-22 DIAGNOSIS — I10 Essential (primary) hypertension: Secondary | ICD-10-CM | POA: Diagnosis present

## 2017-02-22 DIAGNOSIS — R0789 Other chest pain: Secondary | ICD-10-CM | POA: Diagnosis not present

## 2017-02-22 DIAGNOSIS — I4581 Long QT syndrome: Secondary | ICD-10-CM | POA: Diagnosis present

## 2017-02-22 DIAGNOSIS — R1013 Epigastric pain: Secondary | ICD-10-CM

## 2017-02-22 DIAGNOSIS — I700 Atherosclerosis of aorta: Secondary | ICD-10-CM | POA: Diagnosis not present

## 2017-02-22 DIAGNOSIS — R000 Tachycardia, unspecified: Secondary | ICD-10-CM | POA: Diagnosis not present

## 2017-02-22 DIAGNOSIS — E1165 Type 2 diabetes mellitus with hyperglycemia: Secondary | ICD-10-CM | POA: Diagnosis present

## 2017-02-22 DIAGNOSIS — R112 Nausea with vomiting, unspecified: Secondary | ICD-10-CM

## 2017-02-22 DIAGNOSIS — R197 Diarrhea, unspecified: Secondary | ICD-10-CM

## 2017-02-22 DIAGNOSIS — F332 Major depressive disorder, recurrent severe without psychotic features: Secondary | ICD-10-CM | POA: Diagnosis present

## 2017-02-22 DIAGNOSIS — E119 Type 2 diabetes mellitus without complications: Secondary | ICD-10-CM

## 2017-02-22 DIAGNOSIS — R079 Chest pain, unspecified: Secondary | ICD-10-CM | POA: Diagnosis present

## 2017-02-22 DIAGNOSIS — R5383 Other fatigue: Secondary | ICD-10-CM

## 2017-02-22 DIAGNOSIS — F17210 Nicotine dependence, cigarettes, uncomplicated: Secondary | ICD-10-CM | POA: Diagnosis present

## 2017-02-22 DIAGNOSIS — Z880 Allergy status to penicillin: Secondary | ICD-10-CM

## 2017-02-22 DIAGNOSIS — F4310 Post-traumatic stress disorder, unspecified: Secondary | ICD-10-CM | POA: Diagnosis not present

## 2017-02-22 DIAGNOSIS — Z8249 Family history of ischemic heart disease and other diseases of the circulatory system: Secondary | ICD-10-CM | POA: Diagnosis not present

## 2017-02-22 DIAGNOSIS — K3184 Gastroparesis: Secondary | ICD-10-CM | POA: Diagnosis present

## 2017-02-22 DIAGNOSIS — Z90710 Acquired absence of both cervix and uterus: Secondary | ICD-10-CM | POA: Diagnosis not present

## 2017-02-22 DIAGNOSIS — R45851 Suicidal ideations: Secondary | ICD-10-CM | POA: Diagnosis present

## 2017-02-22 DIAGNOSIS — R627 Adult failure to thrive: Secondary | ICD-10-CM | POA: Diagnosis present

## 2017-02-22 DIAGNOSIS — R1110 Vomiting, unspecified: Secondary | ICD-10-CM | POA: Diagnosis present

## 2017-02-22 DIAGNOSIS — R9431 Abnormal electrocardiogram [ECG] [EKG]: Secondary | ICD-10-CM | POA: Diagnosis not present

## 2017-02-22 DIAGNOSIS — F329 Major depressive disorder, single episode, unspecified: Secondary | ICD-10-CM | POA: Diagnosis present

## 2017-02-22 DIAGNOSIS — Z813 Family history of other psychoactive substance abuse and dependence: Secondary | ICD-10-CM | POA: Diagnosis not present

## 2017-02-22 DIAGNOSIS — R450 Nervousness: Secondary | ICD-10-CM

## 2017-02-22 DIAGNOSIS — Z62810 Personal history of physical and sexual abuse in childhood: Secondary | ICD-10-CM | POA: Diagnosis present

## 2017-02-22 DIAGNOSIS — Z79891 Long term (current) use of opiate analgesic: Secondary | ICD-10-CM

## 2017-02-22 DIAGNOSIS — Z9889 Other specified postprocedural states: Secondary | ICD-10-CM | POA: Diagnosis not present

## 2017-02-22 DIAGNOSIS — E860 Dehydration: Secondary | ICD-10-CM | POA: Diagnosis not present

## 2017-02-22 LAB — URINALYSIS, ROUTINE W REFLEX MICROSCOPIC
BILIRUBIN URINE: NEGATIVE
Glucose, UA: NEGATIVE mg/dL
Hgb urine dipstick: NEGATIVE
KETONES UR: 5 mg/dL — AB
Leukocytes, UA: NEGATIVE
Nitrite: NEGATIVE
Protein, ur: NEGATIVE mg/dL
Specific Gravity, Urine: >1.046 — ABNORMAL HIGH (ref 1.005–1.030)
pH: 5 (ref 5.0–8.0)

## 2017-02-22 LAB — COMPREHENSIVE METABOLIC PANEL
ALK PHOS: 125 U/L (ref 38–126)
ALT: 28 U/L (ref 14–54)
AST: 30 U/L (ref 15–41)
Albumin: 4.2 g/dL (ref 3.5–5.0)
Anion gap: 9 (ref 5–15)
BUN: 12 mg/dL (ref 6–20)
CALCIUM: 9.2 mg/dL (ref 8.9–10.3)
CO2: 25 mmol/L (ref 22–32)
CREATININE: 0.95 mg/dL (ref 0.44–1.00)
Chloride: 101 mmol/L (ref 101–111)
GFR calc non Af Amer: >60 mL/min (ref >60)
Glucose, Bld: 268 mg/dL — ABNORMAL HIGH (ref 65–99)
Potassium: 3.7 mmol/L (ref 3.5–5.1)
SODIUM: 135 mmol/L (ref 135–145)
Total Bilirubin: 0.7 mg/dL (ref 0.3–1.2)
Total Protein: 7.5 g/dL (ref 6.5–8.1)

## 2017-02-22 LAB — CBC WITH DIFFERENTIAL/PLATELET
Basophils Absolute: 0 10*3/uL (ref 0.0–0.1)
Basophils Relative: 0 %
EOS ABS: 0.1 10*3/uL (ref 0.0–0.7)
Eosinophils Relative: 1 %
HCT: 41.1 % (ref 36.0–46.0)
HEMOGLOBIN: 14.6 g/dL (ref 12.0–15.0)
LYMPHS PCT: 21 %
Lymphs Abs: 2.7 10*3/uL (ref 0.7–4.0)
MCH: 33.6 pg (ref 26.0–34.0)
MCHC: 35.5 g/dL (ref 30.0–36.0)
MCV: 94.5 fL (ref 78.0–100.0)
Monocytes Absolute: 0.6 10*3/uL (ref 0.1–1.0)
Monocytes Relative: 4 %
NEUTROS PCT: 74 %
Neutro Abs: 9.6 10*3/uL — ABNORMAL HIGH (ref 1.7–7.7)
Platelets: 256 10*3/uL (ref 150–400)
RBC: 4.35 MIL/uL (ref 3.87–5.11)
RDW: 13.3 % (ref 11.5–15.5)
WBC: 13 10*3/uL — AB (ref 4.0–10.5)

## 2017-02-22 LAB — GLUCOSE, CAPILLARY
GLUCOSE-CAPILLARY: 197 mg/dL — AB (ref 65–99)
GLUCOSE-CAPILLARY: 200 mg/dL — AB (ref 65–99)
GLUCOSE-CAPILLARY: 232 mg/dL — AB (ref 65–99)
Glucose-Capillary: 213 mg/dL — ABNORMAL HIGH (ref 65–99)

## 2017-02-22 LAB — TSH: TSH: 0.571 u[IU]/mL (ref 0.350–4.500)

## 2017-02-22 LAB — I-STAT CHEM 8, ED
BUN: 13 mg/dL (ref 6–20)
Calcium, Ion: 1.09 mmol/L — ABNORMAL LOW (ref 1.15–1.40)
Chloride: 102 mmol/L (ref 101–111)
Creatinine, Ser: 0.9 mg/dL (ref 0.44–1.00)
Glucose, Bld: 266 mg/dL — ABNORMAL HIGH (ref 65–99)
HEMATOCRIT: 42 % (ref 36.0–46.0)
HEMOGLOBIN: 14.3 g/dL (ref 12.0–15.0)
POTASSIUM: 3.8 mmol/L (ref 3.5–5.1)
SODIUM: 139 mmol/L (ref 135–145)
TCO2: 27 mmol/L (ref 22–32)

## 2017-02-22 LAB — LIPASE, BLOOD: Lipase: 30 U/L (ref 11–51)

## 2017-02-22 LAB — LIPID PANEL
CHOLESTEROL: 222 mg/dL — AB (ref 0–200)
HDL: 24 mg/dL — ABNORMAL LOW (ref >40)
LDL Cholesterol: UNDETERMINED mg/dL (ref 0–99)
TRIGLYCERIDES: 815 mg/dL — AB (ref <150)
Total CHOL/HDL Ratio: 9.3 RATIO
VLDL: UNDETERMINED mg/dL (ref 0–40)

## 2017-02-22 LAB — I-STAT TROPONIN, ED: TROPONIN I, POC: 0 ng/mL (ref 0.00–0.08)

## 2017-02-22 LAB — CBG MONITORING, ED: GLUCOSE-CAPILLARY: 225 mg/dL — AB (ref 65–99)

## 2017-02-22 LAB — HEMOGLOBIN A1C
HEMOGLOBIN A1C: 7.7 % — AB (ref 4.8–5.6)
Mean Plasma Glucose: 174.29 mg/dL

## 2017-02-22 MED ORDER — IOPAMIDOL (ISOVUE-300) INJECTION 61%
100.0000 mL | Freq: Once | INTRAVENOUS | Status: AC | PRN
  Administered 2017-02-22: 100 mL via INTRAVENOUS

## 2017-02-22 MED ORDER — GABAPENTIN 400 MG PO CAPS
400.0000 mg | ORAL_CAPSULE | Freq: Two times a day (BID) | ORAL | Status: DC
  Administered 2017-02-22: 400 mg via ORAL
  Filled 2017-02-22 (×7): qty 1

## 2017-02-22 MED ORDER — LINAGLIPTIN 5 MG PO TABS
5.0000 mg | ORAL_TABLET | Freq: Every day | ORAL | Status: DC
  Administered 2017-02-22 – 2017-02-25 (×3): 5 mg via ORAL
  Filled 2017-02-22 (×6): qty 1

## 2017-02-22 MED ORDER — PANTOPRAZOLE SODIUM 40 MG PO TBEC
40.0000 mg | DELAYED_RELEASE_TABLET | Freq: Every day | ORAL | Status: DC
  Administered 2017-02-22 – 2017-02-25 (×3): 40 mg via ORAL
  Filled 2017-02-22 (×6): qty 1

## 2017-02-22 MED ORDER — MAGNESIUM HYDROXIDE 400 MG/5ML PO SUSP: 30.0000 mL | Freq: Every day | ORAL | Status: DC | PRN

## 2017-02-22 MED ORDER — GABAPENTIN 800 MG PO TABS
800.0000 mg | ORAL_TABLET | Freq: Two times a day (BID) | ORAL | Status: DC
  Administered 2017-02-22: 800 mg via ORAL
  Filled 2017-02-22 (×3): qty 1

## 2017-02-22 MED ORDER — TRAMADOL HCL 50 MG PO TABS
50.0000 mg | ORAL_TABLET | Freq: Two times a day (BID) | ORAL | Status: DC | PRN
  Administered 2017-02-22: 50 mg via ORAL
  Filled 2017-02-22: qty 1

## 2017-02-22 MED ORDER — TRAZODONE HCL 100 MG PO TABS: 100.0000 mg | ORAL_TABLET | Freq: Every evening | ORAL | Status: DC | PRN

## 2017-02-22 MED ORDER — PAROXETINE HCL 20 MG PO TABS
20.0000 mg | ORAL_TABLET | Freq: Every day | ORAL | Status: DC
  Administered 2017-02-22: 20 mg via ORAL
  Filled 2017-02-22 (×5): qty 1

## 2017-02-22 MED ORDER — BUPROPION HCL ER (XL) 150 MG PO TB24
150.0000 mg | ORAL_TABLET | Freq: Every day | ORAL | Status: DC
  Administered 2017-02-22: 150 mg via ORAL
  Filled 2017-02-22 (×2): qty 1

## 2017-02-22 MED ORDER — NAPROXEN 500 MG PO TABS
500.0000 mg | ORAL_TABLET | Freq: Two times a day (BID) | ORAL | Status: DC | PRN
  Administered 2017-02-22 – 2017-02-23 (×2): 500 mg via ORAL
  Filled 2017-02-22 (×2): qty 1

## 2017-02-22 MED ORDER — INFLUENZA VAC SPLIT QUAD 0.5 ML IM SUSY
0.5000 mL | PREFILLED_SYRINGE | INTRAMUSCULAR | Status: DC
  Filled 2017-02-22: qty 0.5

## 2017-02-22 MED ORDER — IOPAMIDOL (ISOVUE-300) INJECTION 61%
INTRAVENOUS | Status: AC
  Administered 2017-02-22: 17:00:00
  Filled 2017-02-22: qty 100

## 2017-02-22 MED ORDER — FENTANYL CITRATE (PF) 100 MCG/2ML IJ SOLN
100.0000 ug | Freq: Once | INTRAMUSCULAR | Status: AC
  Administered 2017-02-22: 100 ug via INTRAVENOUS
  Filled 2017-02-22: qty 2

## 2017-02-22 MED ORDER — ACETAMINOPHEN 325 MG PO TABS
650.0000 mg | ORAL_TABLET | Freq: Four times a day (QID) | ORAL | Status: DC | PRN
  Administered 2017-02-24 (×2): 650 mg via ORAL
  Filled 2017-02-22 (×2): qty 2

## 2017-02-22 MED ORDER — TRAZODONE HCL 50 MG PO TABS
50.0000 mg | ORAL_TABLET | Freq: Every evening | ORAL | Status: DC | PRN
  Administered 2017-02-22: 50 mg via ORAL

## 2017-02-22 MED ORDER — INSULIN ASPART 100 UNIT/ML ~~LOC~~ SOLN
0.0000 [IU] | Freq: Three times a day (TID) | SUBCUTANEOUS | Status: DC
Start: 2017-02-22 — End: 2017-02-25
  Administered 2017-02-22 – 2017-02-24 (×5): 5 [IU] via SUBCUTANEOUS
  Administered 2017-02-24: 3 [IU] via SUBCUTANEOUS
  Administered 2017-02-24: 5 [IU] via SUBCUTANEOUS
  Administered 2017-02-25: 8 [IU] via SUBCUTANEOUS
  Filled 2017-02-22: qty 1

## 2017-02-22 MED ORDER — INSULIN DETEMIR 100 UNIT/ML ~~LOC~~ SOLN
20.0000 [IU] | Freq: Every evening | SUBCUTANEOUS | Status: DC
  Administered 2017-02-22 – 2017-02-23 (×2): 20 [IU] via SUBCUTANEOUS
  Filled 2017-02-22: qty 0.2

## 2017-02-22 MED ORDER — ARIPIPRAZOLE 10 MG PO TABS
10.0000 mg | ORAL_TABLET | Freq: Every day | ORAL | Status: DC
  Administered 2017-02-22: 10 mg via ORAL
  Filled 2017-02-22 (×2): qty 1

## 2017-02-22 MED ORDER — ONDANSETRON 4 MG PO TBDP
4.0000 mg | ORAL_TABLET | Freq: Three times a day (TID) | ORAL | Status: DC | PRN
  Administered 2017-02-22 (×2): 4 mg via ORAL
  Filled 2017-02-22 (×2): qty 1

## 2017-02-22 MED ORDER — PNEUMOCOCCAL VAC POLYVALENT 25 MCG/0.5ML IJ INJ: 0.5000 mL | INJECTION | INTRAMUSCULAR | Status: DC

## 2017-02-22 MED ORDER — MECLIZINE HCL 25 MG PO TABS: 25.0000 mg | ORAL_TABLET | Freq: Three times a day (TID) | ORAL | 0 refills | Status: DC | PRN

## 2017-02-22 MED ORDER — RISPERIDONE 0.5 MG PO TABS: 0.5000 mg | ORAL_TABLET | Freq: Two times a day (BID) | ORAL | Status: DC | PRN

## 2017-02-22 MED ORDER — SIMVASTATIN 40 MG PO TABS
40.0000 mg | ORAL_TABLET | Freq: Every day | ORAL | Status: DC
  Administered 2017-02-22 – 2017-02-25 (×3): 40 mg via ORAL
  Filled 2017-02-22 (×6): qty 1

## 2017-02-22 MED ORDER — CLINDAMYCIN HCL 150 MG PO CAPS
150.0000 mg | ORAL_CAPSULE | Freq: Three times a day (TID) | ORAL | Status: DC
  Administered 2017-02-22 – 2017-02-25 (×11): 150 mg via ORAL
  Filled 2017-02-22 (×20): qty 1

## 2017-02-22 MED ORDER — SODIUM CHLORIDE 0.9 % IV BOLUS (SEPSIS)
1000.0000 mL | Freq: Once | INTRAVENOUS | Status: AC
  Administered 2017-02-22: 1000 mL via INTRAVENOUS

## 2017-02-22 MED ORDER — FLUTICASONE PROPIONATE 50 MCG/ACT NA SUSP
2.0000 | Freq: Every day | NASAL | Status: DC
  Administered 2017-02-22 – 2017-02-25 (×4): 2 via NASAL
  Filled 2017-02-22: qty 16

## 2017-02-22 MED ORDER — OMEGA-3-ACID ETHYL ESTERS 1 G PO CAPS
1.0000 g | ORAL_CAPSULE | Freq: Two times a day (BID) | ORAL | Status: DC
  Filled 2017-02-22 (×12): qty 1

## 2017-02-22 MED ORDER — INSULIN ASPART 100 UNIT/ML ~~LOC~~ SOLN
0.0000 [IU] | Freq: Every day | SUBCUTANEOUS | Status: DC
  Administered 2017-02-22: 0 [IU] via SUBCUTANEOUS
  Administered 2017-02-23: 2 [IU] via SUBCUTANEOUS

## 2017-02-22 MED ORDER — ONDANSETRON HCL 4 MG/2ML IJ SOLN
4.0000 mg | Freq: Once | INTRAMUSCULAR | Status: AC
  Administered 2017-02-22: 4 mg via INTRAVENOUS
  Filled 2017-02-22: qty 2

## 2017-02-22 MED ORDER — TRAZODONE HCL 100 MG PO TABS
100.0000 mg | ORAL_TABLET | Freq: Every evening | ORAL | Status: DC | PRN
  Administered 2017-02-22: 100 mg via ORAL
  Filled 2017-02-22 (×4): qty 1

## 2017-02-22 MED ORDER — ARIPIPRAZOLE 5 MG PO TABS
5.0000 mg | ORAL_TABLET | Freq: Every day | ORAL | Status: DC
  Filled 2017-02-22 (×3): qty 1

## 2017-02-22 MED ORDER — DICYCLOMINE HCL 20 MG PO TABS: 20.0000 mg | ORAL_TABLET | Freq: Two times a day (BID) | ORAL | 0 refills | Status: AC

## 2017-02-22 MED ORDER — METFORMIN HCL 500 MG PO TABS
1000.0000 mg | ORAL_TABLET | Freq: Two times a day (BID) | ORAL | Status: DC
  Administered 2017-02-22 – 2017-02-25 (×5): 1000 mg via ORAL
  Filled 2017-02-22 (×11): qty 2

## 2017-02-22 MED ORDER — ALUM & MAG HYDROXIDE-SIMETH 200-200-20 MG/5ML PO SUSP: 30.0000 mL | ORAL | Status: DC | PRN

## 2017-02-22 MED ORDER — DIAZEPAM 5 MG PO TABS
5.0000 mg | ORAL_TABLET | Freq: Two times a day (BID) | ORAL | Status: DC | PRN
  Administered 2017-02-22: 5 mg via ORAL
  Filled 2017-02-22: qty 1

## 2017-02-22 MED ORDER — BUPROPION HCL ER (SR) 100 MG PO TB12: 100.0000 mg | ORAL_TABLET | Freq: Two times a day (BID) | ORAL | Status: DC

## 2017-02-22 MED ORDER — ALBUTEROL SULFATE HFA 108 (90 BASE) MCG/ACT IN AERS
2.0000 | INHALATION_SPRAY | Freq: Four times a day (QID) | RESPIRATORY_TRACT | Status: DC | PRN
  Filled 2017-02-22: qty 6.7

## 2017-02-22 MED ORDER — TRAMADOL HCL 50 MG PO TABS: 50.0000 mg | ORAL_TABLET | Freq: Four times a day (QID) | ORAL | Status: DC | PRN

## 2017-02-22 MED ORDER — LOSARTAN POTASSIUM 50 MG PO TABS
100.0000 mg | ORAL_TABLET | Freq: Every day | ORAL | Status: DC
  Administered 2017-02-22 – 2017-02-25 (×3): 100 mg via ORAL
  Filled 2017-02-22 (×7): qty 2

## 2017-02-22 NOTE — ED Notes (Signed)
Bed: VK18 Expected date:  Expected time:  Means of arrival:  Comments: BH: abdominal pain
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
## 2017-02-22 NOTE — Tx Team (Signed)
Initial Treatment Plan 02/22/2017 1:46 AM [name] BEE:[id number]    PATIENT STRESSORS: Financial difficulties Loss of Sister(by overdose over 2 weeks ago) Marital or family conflict Medication change or noncompliance   PATIENT STRENGTHS: Curator fund of knowledge Motivation for treatment/growth Religious Affiliation   PATIENT IDENTIFIED PROBLEMS: Depression  Suicidal ideation  "Get out of this depression that I am in"  "Bet here to relax my brain"               DISCHARGE CRITERIA:  Improved stabilization in mood, thinking, and/or behavior Verbal commitment to aftercare and medication compliance  PRELIMINARY DISCHARGE PLAN: Outpatient therapy Medication management  PATIENT/FAMILY INVOLVEMENT: This treatment plan has been presented to and reviewed with the patient, Cassie Cuevas.  The patient and family have been given the opportunity to ask questions and make suggestions.  Windell Moment, RN 02/22/2017, 1:46 AM

## 2017-02-22 NOTE — Progress Notes (Signed)
Paged by Dr Parke Poisson regarding patient's elevated triglyceride, diabetes meds and chronic foot ulcers. Chart reviewed, patient dose not have fever, no leukocytosis. Per Dr Parke Poisson foot ulcer is not draining, no pain. Recommend close outpatient follow up for diabetic foot ulcer ( podiatry, vascular studies) For HLD with elevated triglyceride, continue zocor, start fish oil. For insulin dependent diabetes, a1c 7.7, blood sugar stable, good oral intake. Continue home oral meds including metformin, Januvia. Continue sliding scale, per chart review, patient is on levemir 80units daily, not sure if patient is compliant with diet or meds at home.  for now will start at 20units daily ( per 0.3unit/kg), may need to increase if blood sugar start to elevate.  Please call us back if further questions.

## 2017-02-22 NOTE — Progress Notes (Signed)
Patient did not attend wrap up group. 

## 2017-02-22 NOTE — ED Triage Notes (Signed)
Patient has epigastric pain and burning. Patient states she has a history of pancreatitis.
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
## 2017-02-22 NOTE — Progress Notes (Signed)
Pt sent to [facility] via [facility] accompanied by staff. Pt complained of epigastric pain unresolved by pain medication.

## 2017-02-22 NOTE — Progress Notes (Signed)
Cassie Cuevas is a 61 year old female being admitted voluntarily to [facility] from [facility] ED.  She came in with worsening depression and passive SI.  Recent stressors are her youngest sister passing away and doesn't feel supported by her husband.  She is diagnosed with Diabetes, fibromyalgia, high cholesterol, HTN and cirrhosis of the liver.  During [facility] admission, she was very tearful, sad and in pain.  She continues to voice suicidal ideation but will contract for safety on the unit.  She denied HI or A/V hallucinations.  She reported history of multiple psychiatric hospitalizations in the past.  Oriented her to the unit.  Admission paperwork completed and signed.  Belongings searched and secured in locker # 5.  Skin assessment completed and noted blisters on top of right foot-treated in the ED with Keflex.  Q 15 minute checks initiated for safety.  We will monitor the progress towards her goals.

## 2017-02-22 NOTE — ED Provider Notes (Signed)
[facility] DEPT Provider Note   CSN: [id number] Arrival date & time: 02/22/17  1423     History   Chief Complaint No chief complaint on file.   HPI Cassie Cuevas is a 61 y.o. female who is currently under psych care for MDD/SI. She  has a past medical history of Diabetes mellitus without complication ([place]); Hypercholesterolemia; Hypertension; and IBS (irritable bowel syndrome).  The patient was sent over with c/o abdominal pain /N/V.  Patient states that she has a history of pancreatitis and this feels similar.  She is a past surgical history of a hernia repair and cholecystectomy.  The patient states that her symptoms began after eating lunch.  She developed severe epigastric pain and has had several episodes of vomiting.  An attendant is with her from behavioral health [facility] who states that no one else has been sick.  She denies fever or chills.  She does note some chest pain which she attributes to vomiting.  She is a history also of diabetes, hypertension, hyperlipidemia and is a current daily smoker.  She is unaware of any history of coronary artery disease or previous heart troubles.   HPI  Past Medical History:  Diagnosis Date  . Diabetes mellitus without complication ([place])   . Hypercholesterolemia   . Hypertension   . IBS (irritable bowel syndrome)     Patient Active Problem List   Diagnosis Date Noted  . MDD (major depressive disorder), recurrent episode, severe ([place]) 02/22/2017    Past Surgical History:  Procedure Laterality Date  . CHOLECYSTECTOMY    . HERNIA REPAIR  X2  . TOTAL ABDOMINAL HYSTERECTOMY      OB History    No data available       Home Medications    Prior to Admission medications   Medication Sig Start Date End Date Taking? Authorizing Provider  ARIPiprazole (ABILIFY) 10 MG tablet Take 10 mg by mouth daily.  07/27/15  Yes [provider]  diazepam (VALIUM) 10 MG tablet Take 10 mg by mouth 2 (two)  times daily.  07/28/15  Yes [provider]  dicyclomine (BENTYL) 10 MG capsule Take 10 mg by mouth 3 (three) times daily as needed for spasms.  08/13/15  Yes [provider]  esomeprazole (NEXIUM) 40 MG capsule Take 40 mg by mouth daily.  08/13/15  Yes [provider]  fluticasone (FLONASE) 50 MCG/ACT nasal spray Place 2 sprays into both nostrils daily as needed for allergies. For allergies  08/13/15  Yes [provider]  gabapentin (NEURONTIN) 800 MG tablet Take 800-1,600 tablets by mouth 3 (three) times daily. Take one tablet by mouth twice daily and take two tablets at bed time 07/26/15  Yes [provider]  glimepiride (AMARYL) 4 MG tablet Take 4 mg by mouth 2 (two) times daily. 01/26/17  Yes [provider]  HYDROcodone-acetaminophen (NORCO) 7.5-325 MG tablet Take 1 tablet by mouth 2 (two) times daily. 07/28/15  Yes [provider]  losartan (COZAAR) 100 MG tablet Take 100 mg by mouth daily.  07/28/15  Yes [provider]  metFORMIN (GLUCOPHAGE) 500 MG tablet Take 1,000-1,500 tablets by mouth 2 (two) times daily. Take 3 tablets by mouth each morning and 2 tablets by mouth each evening. 06/14/15  Yes [provider]  PARoxetine (PAXIL) 30 MG tablet Take 60 mg by mouth daily. 06/11/12  Yes [provider]  promethazine (PHENERGAN) 25 MG tablet Take 25 mg by mouth every 4 (four) hours as  needed. 01/22/17  Yes [provider]  simvastatin (ZOCOR) 40 MG tablet Take 40 mg by mouth daily.  07/27/15  Yes [provider]  traMADol (ULTRAM) 50 MG tablet Take 50 mg by mouth every 6 (six) hours as needed (For pain.). For fibromyalgia pain 07/28/15  Yes [provider]  VENTOLIN HFA 108 (90 Base) MCG/ACT inhaler Inhale 2 puffs into the lungs every 4 (four) hours as needed for wheezing or shortness of breath. wheezing 08/09/15  Yes [provider]  JANUVIA 100 MG tablet Take 100 mg by mouth daily.  07/26/15    [provider]  LEVEMIR FLEXTOUCH 100 UNIT/ML Pen Inject 80 Units into the skin every evening. 07/26/15   [provider]  NOVOLOG FLEXPEN 100 UNIT/ML FlexPen Inject 20 Units into the skin 2 (two) times daily before a meal. 07/26/15   [provider]    Family History History reviewed. No pertinent family history.  Social History Social History  Substance Use Topics  . Smoking status: Current Every Day Smoker    Packs/day: 1.00    Types: Cigarettes  . Smokeless tobacco: Never Used  . Alcohol use No     Allergies   Penicillins and Tape   Review of Systems Review of Systems Ten systems reviewed and are negative for acute change, except as noted in the HPI.    Physical Exam Updated Vital Signs BP (!) 177/98 (BP Location: Left Arm)   Pulse (!) 108   Temp 98.3 F (36.8 C) (Oral)   Resp 18   Ht 5\' 3"  (1.6 m)   Wt 60.8 kg (134 lb)   SpO2 97%   BMI 23.74 kg/m   Physical Exam  Constitutional: She is oriented to person, place, and time. She appears well-developed and well-nourished. No distress.  HENT:  Head: Normocephalic and atraumatic.  Eyes: Conjunctivae are normal. No scleral icterus.  Neck: Normal range of motion.  Cardiovascular: Normal rate, regular rhythm and normal heart sounds.  Exam reveals no gallop and no friction rub.   No murmur heard. Pulmonary/Chest: Effort normal and breath sounds normal. No respiratory distress.  Abdominal: Soft. Bowel sounds are normal. She exhibits no distension and no mass. There is tenderness. There is no guarding.  Epigastrium and LUQ  Neurological: She is alert and oriented to person, place, and time.  Skin: Skin is warm and dry. She is not diaphoretic.  Psychiatric: Her behavior is normal.  Nursing note and vitals reviewed.    ED Treatments / Results  Labs (all labs ordered are listed, but only abnormal results are displayed) Labs Reviewed  GLUCOSE, CAPILLARY - Abnormal; Notable for the  following:       Result Value   Glucose-Capillary 197 (*)    All other components within normal limits  HEMOGLOBIN A1C - Abnormal; Notable for the following:    Hgb A1c MFr Bld 7.7 (*)    All other components within normal limits  LIPID PANEL - Abnormal; Notable for the following:    Cholesterol 222 (*)    Triglycerides 815 (*)    HDL 24 (*)    All other components within normal limits  GLUCOSE, CAPILLARY - Abnormal; Notable for the following:    Glucose-Capillary 213 (*)    All other components within normal limits  GLUCOSE, CAPILLARY - Abnormal; Notable for the following:    Glucose-Capillary 232 (*)    All other components within normal limits  TSH  PROLACTIN  CBC WITH DIFFERENTIAL/PLATELET  COMPREHENSIVE METABOLIC  PANEL  LIPASE, BLOOD  URINALYSIS, ROUTINE W REFLEX MICROSCOPIC  I-STAT CHEM 8, ED    EKG  EKG Interpretation  Date/Time:  Sunday February 25 2017 10:11:28 EST Ventricular Rate:  110 PR Interval:    QRS Duration: 101 QT Interval:  376 QTC Calculation: 509 R Axis:   159 Text Interpretation:  Sinus tachycardia RAE, consider biatrial enlargement Right ventricular hypertrophy Nonspecific T abnormalities, anterior leads Borderline prolonged QT interval When compared to ECG yesterday, flattened T wave in lead V3 and T wave inversion in lead V4.  No STEMI Confirmed by Antony Blackbird [phone]) on 02/25/2017 11:34:39 AM       Radiology No results found.  Procedures Procedures (including critical care time)  Medications Ordered in ED Medications  pantoprazole (PROTONIX) EC tablet 40 mg (40 mg Oral Given 02/22/17 0834)  fluticasone (FLONASE) 50 MCG/ACT nasal spray 2 spray (2 sprays Each Nare Given 02/22/17 0835)  linagliptin (TRADJENTA) tablet 5 mg (5 mg Oral Given 02/22/17 0834)  losartan (COZAAR) tablet 100 mg (100 mg Oral Given 02/22/17 0834)  metFORMIN (GLUCOPHAGE) tablet 1,000 mg (1,000 mg Oral Given 02/22/17 0834)  naproxen (NAPROSYN) tablet 500 mg (500 mg Oral  Given 02/22/17 1314)  simvastatin (ZOCOR) tablet 40 mg (40 mg Oral Given 02/22/17 0834)  albuterol (PROVENTIL HFA;VENTOLIN HFA) 108 (90 Base) MCG/ACT inhaler 2 puff (not administered)  acetaminophen (TYLENOL) tablet 650 mg (not administered)  alum & mag hydroxide-simeth (MAALOX/MYLANTA) 200-200-20 MG/5ML suspension 30 mL (not administered)  magnesium hydroxide (MILK OF MAGNESIA) suspension 30 mL (not administered)  clindamycin (CLEOCIN) capsule 150 mg (150 mg Oral Given 02/22/17 1314)  insulin aspart (novoLOG) injection 0-15 Units (5 Units Subcutaneous Given 02/22/17 1214)  insulin aspart (novoLOG) injection 0-5 Units (0 Units Subcutaneous Given 02/22/17 0117)  Influenza vac split quadrivalent PF (FLUARIX) injection 0.5 mL (not administered)  pneumococcal 23 valent vaccine (PNU-IMMUNE) injection 0.5 mL (not administered)  ARIPiprazole (ABILIFY) tablet 5 mg (not administered)  diazepam (VALIUM) tablet 5 mg (not administered)  gabapentin (NEURONTIN) capsule 400 mg (not administered)  traZODone (DESYREL) tablet 50 mg (not administered)  traMADol (ULTRAM) tablet 50 mg (not administered)  PARoxetine (PAXIL) tablet 20 mg (20 mg Oral Given 02/22/17 1216)  omega-3 acid ethyl esters (LOVAZA) capsule 1 g (not administered)  insulin detemir (LEVEMIR) injection 20 Units (not administered)  ondansetron (ZOFRAN-ODT) disintegrating tablet 4 mg (4 mg Oral Given 02/22/17 1314)  sodium chloride 0.9 % bolus 1,000 mL (not administered)  fentaNYL (SUBLIMAZE) injection 100 mcg (not administered)  ondansetron (ZOFRAN) injection 4 mg (not administered)     Initial Impression / Assessment and Plan / ED Course  I have reviewed the triage vital signs and the nursing notes.  Pertinent labs & imaging results that were available during my care of the patient were reviewed by me and considered in my medical decision making (see chart for details).    Patient work up negative for abnormality on labs and CT.  She has some  nausea still however it has improved. The patient is tolerating fluids by mouth and appears appropriate to return to bhh.  Final Clinical Impressions(s) / ED Diagnoses   Final diagnoses:  Epigastric pain  Nausea  Non-intractable vomiting with nausea, unspecified vomiting type  Chest pain, unspecified type  Prolonged QT interval    New Prescriptions New Prescriptions   No medications on file     Margarita Mail, PA-C 02/26/17 0160    Lacretia Leigh, MD 02/26/17 1442

## 2017-02-23 DIAGNOSIS — R9431 Abnormal electrocardiogram [ECG] [EKG]: Secondary | ICD-10-CM

## 2017-02-23 DIAGNOSIS — E1143 Type 2 diabetes mellitus with diabetic autonomic (poly)neuropathy: Secondary | ICD-10-CM

## 2017-02-23 DIAGNOSIS — K3184 Gastroparesis: Secondary | ICD-10-CM

## 2017-02-23 LAB — GLUCOSE, CAPILLARY
GLUCOSE-CAPILLARY: 292 mg/dL — AB (ref 65–99)
GLUCOSE-CAPILLARY: 268 mg/dL — AB (ref 65–99)
Glucose-Capillary: 247 mg/dL — ABNORMAL HIGH (ref 65–99)
Glucose-Capillary: 209 mg/dL — ABNORMAL HIGH (ref 65–99)

## 2017-02-23 LAB — PROLACTIN: Prolactin: 9.8 ng/mL (ref 4.8–23.3)

## 2017-02-23 MED ORDER — MIRTAZAPINE 7.5 MG PO TABS
7.5000 mg | ORAL_TABLET | Freq: Every day | ORAL | Status: DC
  Administered 2017-02-23 – 2017-02-24 (×2): 7.5 mg via ORAL
  Filled 2017-02-23 (×5): qty 1

## 2017-02-23 MED ORDER — GABAPENTIN 400 MG PO CAPS
400.0000 mg | ORAL_CAPSULE | Freq: Once | ORAL | Status: AC
  Administered 2017-02-23: 400 mg via ORAL
  Filled 2017-02-23 (×2): qty 1

## 2017-02-23 MED ORDER — MAGNESIUM OXIDE 400 (241.3 MG) MG PO TABS
400.0000 mg | ORAL_TABLET | Freq: Every day | ORAL | Status: DC
  Administered 2017-02-23 – 2017-02-24 (×2): 400 mg via ORAL
  Filled 2017-02-23 (×4): qty 1

## 2017-02-23 MED ORDER — PROMETHAZINE HCL 12.5 MG RE SUPP
12.5000 mg | Freq: Once | RECTAL | Status: AC
  Filled 2017-02-23: qty 1

## 2017-02-23 MED ORDER — ONDANSETRON 4 MG PO TBDP
8.0000 mg | ORAL_TABLET | Freq: Three times a day (TID) | ORAL | Status: DC | PRN
  Administered 2017-02-23: 8 mg via ORAL
  Filled 2017-02-23: qty 2

## 2017-02-23 MED ORDER — INSULIN ASPART 100 UNIT/ML ~~LOC~~ SOLN
3.0000 [IU] | Freq: Three times a day (TID) | SUBCUTANEOUS | Status: DC
  Administered 2017-02-24 – 2017-02-25 (×2): 3 [IU] via SUBCUTANEOUS

## 2017-02-23 MED ORDER — DIAZEPAM 5 MG PO TABS
5.0000 mg | ORAL_TABLET | Freq: Once | ORAL | Status: AC
  Administered 2017-02-23: 5 mg via ORAL
  Filled 2017-02-23: qty 1

## 2017-02-23 MED ORDER — PROMETHAZINE HCL 12.5 MG PO TABS
12.5000 mg | ORAL_TABLET | Freq: Once | ORAL | Status: AC
  Filled 2017-02-23: qty 1

## 2017-02-23 MED ORDER — DIAZEPAM 5 MG PO TABS
5.0000 mg | ORAL_TABLET | Freq: Three times a day (TID) | ORAL | Status: DC | PRN
  Administered 2017-02-23 – 2017-02-25 (×4): 5 mg via ORAL
  Filled 2017-02-23 (×5): qty 1

## 2017-02-23 MED ORDER — INSULIN DETEMIR 100 UNIT/ML ~~LOC~~ SOLN
15.0000 [IU] | Freq: Two times a day (BID) | SUBCUTANEOUS | Status: DC
  Administered 2017-02-24 – 2017-02-25 (×3): 15 [IU] via SUBCUTANEOUS
  Filled 2017-02-23 (×2): qty 0.15

## 2017-02-23 MED ORDER — GABAPENTIN 400 MG PO CAPS
800.0000 mg | ORAL_CAPSULE | Freq: Two times a day (BID) | ORAL | Status: DC
  Administered 2017-02-23 – 2017-02-25 (×5): 800 mg via ORAL
  Filled 2017-02-23 (×6): qty 2

## 2017-02-23 MED ORDER — POTASSIUM CHLORIDE 20 MEQ/15ML (10%) PO SOLN
40.0000 meq | Freq: Once | ORAL | Status: AC
Start: 2017-02-23 — End: 2017-02-23
  Administered 2017-02-23: 40 meq via ORAL
  Filled 2017-02-23: qty 30

## 2017-02-23 MED ORDER — PROMETHAZINE HCL 25 MG/ML IJ SOLN
12.5000 mg | Freq: Once | INTRAMUSCULAR | Status: AC
  Administered 2017-02-23: 12.5 mg via INTRAMUSCULAR
  Filled 2017-02-23 (×2): qty 1

## 2017-02-23 NOTE — Consult Note (Signed)
Patient Demographics  Cassie Cuevas, is a 61 y.o. female   MRN: [id number]   DOB - 1955-12-28  Admit Date - 02/22/2017    Outpatient Primary MD for the patient is [name], MD  Consult requested in the Hospital by No att. providers found, On 02/23/2017    Reason for consult : n/v/abdominal pain   With History of -  Past Medical History:  Diagnosis Date  . Diabetes mellitus without complication ([place])   . Hypercholesterolemia   . Hypertension   . IBS (irritable bowel syndrome)       Past Surgical History:  Procedure Laterality Date  . CHOLECYSTECTOMY    . HERNIA REPAIR  X2  . TOTAL ABDOMINAL HYSTERECTOMY      Admitted to behavioral health due to depression , suicidal ieation   Chief Complaint  Patient presents with  . Abdominal Pain     HPI  Cassie Cuevas  is a 61 y.o. female, h/o insulin dependent dm2, noncompliance with insulin, h/o HTN, anxiety, depression, she is   Admitted to behavioral health due to depression , suicidal ideation. she c/o ab pain, n/v yesterday pm, She was sent to ED with a negative ct ab, and unremarkable cmp except blood glucose 266 .  Lipase wnl, troponin negative. She was sent back to behavioral health. Medicine consulted today due to patient continued to c/o ab pain, feeling nauseous. No vomiting, no fever.  She reports baseline unsteady, used electrical scooters to get around. She reports recent weight loss. She reports chronic history of fibromyalgia, numbness and tingling in feets  Review of Systems    In addition to the HPI above,  No Fever-chills, No Headache, No changes with Vision or hearing, No problems swallowing food or Liquids, No Chest pain, Cough or Shortness of Breath, No Blood in stool or Urine, No dysuria, No new joints pains-aches,  No polyuria, polydypsia or polyphagia,   A full 10 point Review of Systems was done, except  as stated above, all other Review of Systems were negative.   Social History Social History  Substance Use Topics  . Smoking status: Current Every Day Smoker    Packs/day: 1.00    Types: Cigarettes  . Smokeless tobacco: Never Used  . Alcohol use No     Family History History reviewed. No pertinent family history.   Prior to Admission medications   Medication Sig Start Date End Date Taking? Authorizing Provider  ARIPiprazole (ABILIFY) 10 MG tablet Take 10 mg by mouth daily.  07/27/15  Yes [provider]  diazepam (VALIUM) 10 MG tablet Take 10 mg by mouth 2 (two) times daily.  07/28/15  Yes [provider]  esomeprazole (NEXIUM) 40 MG capsule Take 40 mg by mouth daily.  08/13/15  Yes [provider]  fluticasone (FLONASE) 50 MCG/ACT nasal spray Place 2 sprays into both nostrils daily as needed for allergies. For allergies  08/13/15  Yes [provider]  gabapentin (NEURONTIN) 800 MG tablet Take 800-1,600 tablets by  mouth 3 (three) times daily. Take one tablet by mouth twice daily and take two tablets at bed time 07/26/15  Yes [provider]  glimepiride (AMARYL) 4 MG tablet Take 4 mg by mouth 2 (two) times daily. 01/26/17  Yes [provider]  HYDROcodone-acetaminophen (NORCO) 7.5-325 MG tablet Take 1 tablet by mouth 2 (two) times daily. 07/28/15  Yes [provider]  losartan (COZAAR) 100 MG tablet Take 100 mg by mouth daily.  07/28/15  Yes [provider]  metFORMIN (GLUCOPHAGE) 500 MG tablet Take 1,000-1,500 tablets by mouth 2 (two) times daily. Take 3 tablets by mouth each morning and 2 tablets by mouth each evening. 06/14/15  Yes [provider]  PARoxetine (PAXIL) 30 MG tablet Take 60 mg by mouth daily. 06/11/12  Yes [provider]  promethazine (PHENERGAN) 25 MG tablet Take 25 mg by mouth every 4 (four) hours as needed. 01/22/17  Yes [provider]  simvastatin (ZOCOR) 40 MG tablet Take 40 mg by  mouth daily.  07/27/15  Yes [provider]  traMADol (ULTRAM) 50 MG tablet Take 50 mg by mouth every 6 (six) hours as needed (For pain.). For fibromyalgia pain 07/28/15  Yes [provider]  VENTOLIN HFA 108 (90 Base) MCG/ACT inhaler Inhale 2 puffs into the lungs every 4 (four) hours as needed for wheezing or shortness of breath. wheezing 08/09/15  Yes [provider]  dicyclomine (BENTYL) 20 MG tablet Take 1 tablet (20 mg total) by mouth 2 (two) times daily. 02/22/17   Harris, Abigail, PA-C  JANUVIA 100 MG tablet Take 100 mg by mouth daily.  07/26/15   [provider]  LEVEMIR FLEXTOUCH 100 UNIT/ML Pen Inject 80 Units into the skin every evening. 07/26/15   [provider]  meclizine (ANTIVERT) 25 MG tablet Take 1 tablet (25 mg total) by mouth 3 (three) times daily as needed for nausea. 02/22/17   Harris, Abigail, PA-C  NOVOLOG FLEXPEN 100 UNIT/ML FlexPen Inject 20 Units into the skin 2 (two) times daily before a meal. 07/26/15   [provider]    Anti-infectives    Start     Dose/Rate Route Frequency Ordered Stop   02/22/17 0115  clindamycin (CLEOCIN) capsule 150 mg     150 mg Oral Every 8 hours 02/22/17 0104 02/28/17 2159      Scheduled Meds: . ARIPiprazole  5 mg Oral Daily  . clindamycin  150 mg Oral Q8H  . fluticasone  2 spray Each Nare Daily  . gabapentin  400 mg Oral BID  . Influenza vac split quadrivalent PF  0.5 mL Intramuscular Tomorrow-1000  . insulin aspart  0-15 Units Subcutaneous TID WC  . insulin aspart  0-5 Units Subcutaneous QHS  . insulin detemir  20 Units Subcutaneous QPM  . linagliptin  5 mg Oral Daily  . losartan  100 mg Oral Daily  . metFORMIN  1,000 mg Oral BID WC  . omega-3 acid ethyl esters  1 g Oral BID  . pantoprazole  40 mg Oral Daily  . PARoxetine  20 mg Oral Daily  . pneumococcal 23 valent vaccine  0.5 mL Intramuscular Tomorrow-1000  . simvastatin  40 mg Oral Daily   Continuous Infusions: PRN  Meds:.acetaminophen, albuterol, alum & mag hydroxide-simeth, diazepam, magnesium hydroxide, naproxen, ondansetron, traMADol, traZODone  Allergies  Allergen Reactions  . Penicillins Nausea And Vomiting, Rash and Other (See Comments)    Has patient had a PCN reaction causing immediate rash, facial/tongue/throat swelling, SOB or  lightheadedness with hypotension: yes Has patient had a PCN reaction causing severe rash involving mucus membranes or skin necrosis: no Has patient had a PCN reaction that required hospitalization: no Has patient had a PCN reaction occurring within the last 10 years: yes If all of the above answers are "NO", then may proceed with Cephalosporin use.    . Tape Rash and Other (See Comments)    needs paper tape     Physical Exam  Vitals  Blood pressure (!) 179/86, pulse 85, temperature 98.2 F (36.8 C), temperature source Oral, resp. rate 16, height 5\' 3"  (1.6 m), weight 60.8 kg (134 lb), SpO2 96 %.   1. General , sitting in wheelchair, and rolling wheelchair around in hallway  2. Flat affect , Awake Alert, Oriented X 3. Thin, malnurished  3. No F.N deficits, ALL C.Nerves Intact, Strength 5/5 all 4 extremities, Sensation intact all 4 extremities, Plantars down going.  4. Ears and Eyes appear Normal, Conjunctivae clear, PERRLA. Moist Oral Mucosa.  5. Supple Neck, No JVD, No cervical lymphadenopathy appriciated, No Carotid Bruits.  6. Symmetrical Chest wall movement, Good air movement bilaterally, CTAB.  7. RRR, No Gallops, Rubs or Murmurs, No Parasternal Heave.  8. Positive Bowel Sounds, Abdomen Soft, No tenderness, No organomegaly appriciated,No rebound -guarding or rigidity.  9.  No Cyanosis, Normal Skin Turgor, she does has a few small blisters on right foot toes, dorsal surface, lateral side. No associated erythema, no drainage. Nontender. (She reports she has been wearing very tight shoes)  10. Good muscle tone,  joints appear normal , no effusions,  Normal ROM.  11. No Palpable Lymph Nodes in Neck or Axillae   Data Review  CBC  Recent Labs Lab 02/22/17 1615 02/22/17 1621  WBC 13.0*  --   HGB 14.6 14.3  HCT 41.1 42.0  PLT 256  --   MCV 94.5  --   MCH 33.6  --   MCHC 35.5  --   RDW 13.3  --   LYMPHSABS 2.7  --   MONOABS 0.6  --   EOSABS 0.1  --   BASOSABS 0.0  --    ------------------------------------------------------------------------------------------------------------------  Chemistries   Recent Labs Lab 02/22/17 1615 02/22/17 1621  NA 135 139  K 3.7 3.8  CL 101 102  CO2 25  --   GLUCOSE 268* 266*  BUN 12 13  CREATININE 0.95 0.90  CALCIUM 9.2  --   AST 30  --   ALT 28  --   ALKPHOS 125  --   BILITOT 0.7  --    ------------------------------------------------------------------------------------------------------------------ estimated creatinine clearance is 55.7 mL/min (by C-G formula based on SCr of 0.9 mg/dL). ------------------------------------------------------------------------------------------------------------------  Recent Labs  02/22/17 0610  TSH 0.571     Coagulation profile No results for input(s): INR, PROTIME in the last 168 hours. ------------------------------------------------------------------------------------------------------------------- No results for input(s): DDIMER in the last 72 hours. -------------------------------------------------------------------------------------------------------------------  Cardiac Enzymes No results for input(s): CKMB, TROPONINI, MYOGLOBIN in the last 168 hours.  Invalid input(s): CK ------------------------------------------------------------------------------------------------------------------ Invalid input(s): POCBNP   ---------------------------------------------------------------------------------------------------------------  Urinalysis    Component Value Date/Time   COLORURINE YELLOW 02/22/2017 1943   APPEARANCEUR  CLEAR 02/22/2017 1943   LABSPEC >1.046 (H) 02/22/2017 1943   PHURINE 5.0 02/22/2017 1943   GLUCOSEU NEGATIVE 02/22/2017 1943   HGBUR NEGATIVE 02/22/2017 [place] NEGATIVE 02/22/2017 1943   KETONESUR 5 (A) 02/22/2017 1943   PROTEINUR NEGATIVE 02/22/2017 1943   NITRITE NEGATIVE 02/22/2017 1943   LEUKOCYTESUR NEGATIVE  02/22/2017 1943     Imaging results:   Ct Abdomen Pelvis W Contrast  Result Date: 02/22/2017 CLINICAL DATA:  Back pain. Cancer or infection suspected. History of pancreatitis. EXAM: CT ABDOMEN AND PELVIS WITH CONTRAST TECHNIQUE: Multidetector CT imaging of the abdomen and pelvis was performed using the standard protocol following bolus administration of intravenous contrast. CONTRAST:  126mL ISOVUE-300 IOPAMIDOL (ISOVUE-300) INJECTION 61% COMPARISON:  None. FINDINGS: Lower chest: Asymmetric thoracic shape that is likely developmental. Hepatobiliary: No focal liver abnormality.Cholecystectomy which may account for common bile duct prominence (10 mm). On coronal reformats the lower common bile duct appears to loose is low-density lumen, but this is not confirmed on axial slices. Pancreas: Unremarkable. Spleen: Unremarkable. Adrenals/Urinary Tract: Negative adrenals. No hydronephrosis or stone. Left renal atrophy. Calcification along the proximal right ureter is a phlebolith. Subcentimeter right renal cyst. Unremarkable bladder. Stomach/Bowel:  No obstruction. No appendicitis. Vascular/Lymphatic: No acute vascular abnormality. Moderate atheromatous wall thickening of the aorta. No mass or adenopathy. Reproductive:Hysterectomy. Possible oophorectomies. There is a degree of pelvic floor laxity. Other: No ascites or pneumoperitoneum. Musculoskeletal: No acute abnormalities. IMPRESSION: 1. No acute finding to explain abdominal pain. 2. Cholecystectomy which may account for mildly dilated common bile duct. Please correlate with in progress liver function tests. 3. Left renal atrophy. 4.   Aortic Atherosclerosis (ICD10-I70.0). Electronically Signed   By: Monte Fantasia M.D.   On: 02/22/2017 17:22       Assessment & Plan  Active Problems:   MDD (major depressive disorder), recurrent episode, severe ([facility])     N/V/abdominal pain Patient was sent to the ED yesterday PM due to c/o  Nausea, vomiting, abdomen pain.  CT abdomen was unremarkable. lipase and cMP was unremarkable. she was given hydration and was sent back to behavioral house.  Patient reports these symptoms are chronic.  I have reviewed outside records under careeverywhere, patient Had multiple GI and general surgery evaluation in the past for the same. There was a concern of psychosomatic symptom.  she has negative egd this year.   Due to long term uncontrolled diabetes, I think  gastroparesis could possible cause these chronic symptoms as well. She is not good candidate for reglan due to prolonged QTc.  Recommend small meals, multiple times a day , sit up during and after meal.  Need better diabetes control long term. Need to close follow up with pmd for this.     Insulin dependent dm2,  She reports she run out all her diabetes meds including insulin and pills for several months. a1c 7.7 She is started on levemir and  ssi.  Toe blisters is not consistent with diabetic foot ulcers, likely from abrasion. No sign of infection.  Diabetes RN input appreciated.   QTc prolongation: avoid Qtc prolonging agent, keep k>4, mag>2  FTT: she does not walk much at baseline, she uses electric scooter to get around at baseline.  Depression/SI: per psychiatry.  Case discussed with Dr. Parke Poisson    Family Communication: Plan discussed with patient   Thank you for the consult, we will sign off. Please call us back if further help needed.   Tayon Parekh M.D PhD on 02/23/2017 at 10:35 AM  Between 7am to 7pm - Pager - [phone]  After 7pm go to [email or web address] - [facility] Office   [phone]

## 2017-02-23 NOTE — Progress Notes (Signed)
D Patient put on nurse call bell at change of shift today. Writer responded to her room. She is in a fetal position in  Her bed. She is rocking herself gently side - to - side as she speaks with this Probation officer. She is crying dramatically, yelling  ( tears produced)./ She says " you gotta give me something.. The doctor  took all my neurontin away yesterday and I can't stand being without it My stomach's killing me and I keep throwing up and I'm terribly nauseated  . " A  Patient is observed, she wears red  patient scrubs sitting on her bed....she refuses any meds offered by this writer, she refuses gingerale and or juices and / or l other beverages, she does not want to take any meds, insulin and /or food..she says " something's wrong./..my stomach really hurts and I need to go back to the ED NOW" . Writer spoke with patient, explaining process of having MD assess her as soon as physician arrives at this hospital. R  Patient contracts for safety with this Probation officer and patient positioned to comfort in bed.

## 2017-02-23 NOTE — Tx Team (Signed)
Interdisciplinary Treatment and Diagnostic Plan Update 02/23/2017 Time of Session: 9:30am  Cassie Cuevas  MRN: [id number]  Principal Diagnosis: Major Depression, Recurrent, no Psychotic Symptoms  Secondary Diagnoses: Active Problems:   MDD (major depressive disorder), recurrent episode, severe (HCC)   Current Medications:  Current Facility-Administered Medications  Medication Dose Route Frequency Provider Last Rate Last Dose  . acetaminophen (TYLENOL) tablet 650 mg  650 mg Oral Q6H PRN Patriciaann Clan E, PA-C      . albuterol (PROVENTIL HFA;VENTOLIN HFA) 108 (90 Base) MCG/ACT inhaler 2 puff  2 puff Inhalation Q6H PRN Laverle Hobby, PA-C      . alum & mag hydroxide-simeth (MAALOX/MYLANTA) 200-200-20 MG/5ML suspension 30 mL  30 mL Oral Q4H PRN Patriciaann Clan E, PA-C      . ARIPiprazole (ABILIFY) tablet 5 mg  5 mg Oral Daily Cobos, Fernando A, MD      . clindamycin (CLEOCIN) capsule 150 mg  150 mg Oral Q8H Simon, Spencer E, PA-C   150 mg at 02/23/17 1191  . diazepam (VALIUM) tablet 5 mg  5 mg Oral Q12H PRN Cobos, Myer Peer, MD   5 mg at 02/22/17 2201  . fluticasone (FLONASE) 50 MCG/ACT nasal spray 2 spray  2 spray Each Nare Daily Laverle Hobby, PA-C   2 spray at 02/22/17 0835  . gabapentin (NEURONTIN) capsule 400 mg  400 mg Oral BID Cobos, Myer Peer, MD   400 mg at 02/22/17 2003  . Influenza vac split quadrivalent PF (FLUARIX) injection 0.5 mL  0.5 mL Intramuscular Tomorrow-1000 Cobos, Fernando A, MD      . insulin aspart (novoLOG) injection 0-15 Units  0-15 Units Subcutaneous TID WC Patriciaann Clan E, PA-C   5 Units at 02/22/17 1848  . insulin aspart (novoLOG) injection 0-5 Units  0-5 Units Subcutaneous QHS Laverle Hobby, PA-C   0 Units at 02/22/17 0117  . insulin detemir (LEVEMIR) injection 20 Units  20 Units Subcutaneous QPM Florencia Reasons, MD   20 Units at 02/22/17 1849  . linagliptin (TRADJENTA) tablet 5 mg  5 mg Oral Daily Laverle Hobby, PA-C   5 mg at 02/22/17 0834  . losartan  (COZAAR) tablet 100 mg  100 mg Oral Daily Laverle Hobby, PA-C   100 mg at 02/22/17 4782  . magnesium hydroxide (MILK OF MAGNESIA) suspension 30 mL  30 mL Oral Daily PRN Laverle Hobby, PA-C      . metFORMIN (GLUCOPHAGE) tablet 1,000 mg  1,000 mg Oral BID WC Patriciaann Clan E, PA-C   1,000 mg at 02/22/17 0834  . naproxen (NAPROSYN) tablet 500 mg  500 mg Oral BID PRN Laverle Hobby, PA-C   500 mg at 02/23/17 0239  . omega-3 acid ethyl esters (LOVAZA) capsule 1 g  1 g Oral BID Florencia Reasons, MD      . ondansetron (ZOFRAN-ODT) disintegrating tablet 8 mg  8 mg Oral Q8H PRN Lindon Romp A, NP   8 mg at 02/23/17 0121  . pantoprazole (PROTONIX) EC tablet 40 mg  40 mg Oral Daily Laverle Hobby, PA-C   40 mg at 02/22/17 0834  . PARoxetine (PAXIL) tablet 20 mg  20 mg Oral Daily Cobos, Myer Peer, MD   20 mg at 02/22/17 1216  . pneumococcal 23 valent vaccine (PNU-IMMUNE) injection 0.5 mL  0.5 mL Intramuscular Tomorrow-1000 Cobos, Fernando A, MD      . simvastatin (ZOCOR) tablet 40 mg  40 mg Oral Daily Laverle Hobby, PA-C  40 mg at 02/22/17 0834  . traMADol (ULTRAM) tablet 50 mg  50 mg Oral Q12H PRN Cobos, Myer Peer, MD   50 mg at 02/22/17 2004  . traZODone (DESYREL) tablet 50 mg  50 mg Oral QHS PRN Cobos, Myer Peer, MD   50 mg at 02/22/17 2200    PTA Medications: Prescriptions Prior to Admission  Medication Sig Dispense Refill Last Dose  . ARIPiprazole (ABILIFY) 10 MG tablet Take 10 mg by mouth daily.    02/22/2017  . diazepam (VALIUM) 10 MG tablet Take 10 mg by mouth 2 (two) times daily.    02/21/2017  . esomeprazole (NEXIUM) 40 MG capsule Take 40 mg by mouth daily.    02/21/2017  . fluticasone (FLONASE) 50 MCG/ACT nasal spray Place 2 sprays into both nostrils daily as needed for allergies. For allergies    02/22/2017  . gabapentin (NEURONTIN) 800 MG tablet Take 800-1,600 tablets by mouth 3 (three) times daily. Take one tablet by mouth twice daily and take two tablets at bed time   02/21/2017  .  glimepiride (AMARYL) 4 MG tablet Take 4 mg by mouth 2 (two) times daily.  5 unknown  . HYDROcodone-acetaminophen (NORCO) 7.5-325 MG tablet Take 1 tablet by mouth 2 (two) times daily.   02/22/2017  . losartan (COZAAR) 100 MG tablet Take 100 mg by mouth daily.    02/21/2017  . metFORMIN (GLUCOPHAGE) 500 MG tablet Take 1,000-1,500 tablets by mouth 2 (two) times daily. Take 3 tablets by mouth each morning and 2 tablets by mouth each evening.   02/21/2017  . PARoxetine (PAXIL) 30 MG tablet Take 60 mg by mouth daily.   02/21/2017  . promethazine (PHENERGAN) 25 MG tablet Take 25 mg by mouth every 4 (four) hours as needed.  0 02/21/2017  . simvastatin (ZOCOR) 40 MG tablet Take 40 mg by mouth daily.    02/21/2017  . traMADol (ULTRAM) 50 MG tablet Take 50 mg by mouth every 6 (six) hours as needed (For pain.). For fibromyalgia pain   02/21/2017  . VENTOLIN HFA 108 (90 Base) MCG/ACT inhaler Inhale 2 puffs into the lungs every 4 (four) hours as needed for wheezing or shortness of breath. wheezing   3 months ago  . JANUVIA 100 MG tablet Take 100 mg by mouth daily.    6 months or more  . LEVEMIR FLEXTOUCH 100 UNIT/ML Pen Inject 80 Units into the skin every evening.   6 months or more  . NOVOLOG FLEXPEN 100 UNIT/ML FlexPen Inject 20 Units into the skin 2 (two) times daily before a meal.   6 months or more    Treatment Modalities: Medication Management, Group therapy, Case management,  1 to 1 session with clinician, Psychoeducation, Recreational therapy.  Patient Stressors: Financial difficulties Loss of Sister(by overdose over 2 weeks ago) Marital or family conflict Medication change or noncompliance Patient Strengths: Network engineer for treatment/growth Religious Affiliation  Physician Treatment Plan for Primary Diagnosis: Major Depression, Recurrent, no Psychotic Symptoms Long Term Goal(s): Improvement in symptoms so as ready for discharge Short Term Goals:  Ability to identify changes in lifestyle to reduce recurrence of condition will improve Compliance with prescribed medications will improve Ability to verbalize feelings will improve Ability to disclose and discuss suicidal ideas Ability to demonstrate self-control will improve Ability to identify and develop effective coping behaviors will improve Compliance with prescribed medications will improve  Medication Management: Evaluate patient's response, side effects, and tolerance of medication regimen.  Therapeutic Interventions: 1 to 1 sessions, Unit Group sessions and Medication administration.  Evaluation of Outcomes: Not Met  Physician Treatment Plan for Secondary Diagnosis: Active Problems:   MDD (major depressive disorder), recurrent episode, severe ([place])  Long Term Goal(s): Improvement in symptoms so as ready for discharge  Short Term Goals: Ability to identify changes in lifestyle to reduce recurrence of condition will improve Compliance with prescribed medications will improve Ability to verbalize feelings will improve Ability to disclose and discuss suicidal ideas Ability to demonstrate self-control will improve Ability to identify and develop effective coping behaviors will improve Compliance with prescribed medications will improve  Medication Management: Evaluate patient's response, side effects, and tolerance of medication regimen.  Therapeutic Interventions: 1 to 1 sessions, Unit Group sessions and Medication administration.  Evaluation of Outcomes: Not Met  RN Treatment Plan for Primary Diagnosis: Major Depression, Recurrent, no Psychotic Symptoms Long Term Goal(s): Knowledge of disease and therapeutic regimen to maintain health will improve  Short Term Goals: Ability to identify and develop effective coping behaviors will improve and Compliance with prescribed medications will improve  Medication Management: RN will administer medications as ordered by provider,  will assess and evaluate patient's response and provide education to patient for prescribed medication. RN will report any adverse and/or side effects to prescribing provider.  Therapeutic Interventions: 1 on 1 counseling sessions, Psychoeducation, Medication administration, Evaluate responses to treatment, Monitor vital signs and CBGs as ordered, Perform/monitor CIWA, COWS, AIMS and Fall Risk screenings as ordered, Perform wound care treatments as ordered.  Evaluation of Outcomes: Not Met  LCSW Treatment Plan for Primary Diagnosis: Major Depression, Recurrent, no Psychotic Symptoms Long Term Goal(s): Safe transition to appropriate next level of care at discharge, Engage patient in therapeutic group addressing interpersonal concerns. Short Term Goals: Engage patient in aftercare planning with referrals and resources, Facilitate patient progression through stages of change regarding substance use diagnoses and concerns, Identify triggers associated with mental health/substance abuse issues and Increase skills for wellness and recovery  Therapeutic Interventions: Assess for all discharge needs, 1 to 1 time with Social worker, Explore available resources and support systems, Assess for adequacy in community support network, Educate family and significant other(s) on suicide prevention, Complete Psychosocial Assessment, Interpersonal group therapy.  Evaluation of Outcomes: Not Met  Progress in Treatment: Attending groups: Pt is new to milieu, continuing to assess  Participating in groups: Pt is new to milieu, continuing to assess  Taking medication as prescribed: Yes, MD continues to assess for medication changes as needed Toleration medication: Yes, no side effects reported at this time Family/Significant other contact made: No, CSW assessing for appropriate contact Patient understands diagnosis: Continuing to assess Discussing patient identified problems/goals with staff: Yes Medical problems  stabilized or resolved: Yes Denies suicidal/homicidal ideation: No, pt recently admitted with SI. Issues/concerns per patient self-inventory: None Other: N/A  New problem(s) identified: None identified at this time.   New Short Term/Long Term Goal(s): None identified at this time.   Discharge Plan or Barriers: CSW still assessing for an appropriate plan. Pt is not feeling well and will likely discharge to the ED today.  Reason for Continuation of Hospitalization:  Depression Anxiety Medication stabilization Suicidal ideation  Estimated Length of Stay: 1-3 days; Estimated discharge date 02/26/17  Attendees: Patient: 02/23/2017 10:36 AM  Physician: Dr. Parke Poisson 02/23/2017 10:36 AM  Nursing: Chong Sicilian, RN 02/23/2017 10:36 AM  RN Care Manager: Lars Pinks, RN 02/23/2017 10:36 AM  Social Worker: Matthew Saras, [place] 02/23/2017 10:36 AM  Recreational Therapist:  02/23/2017 10:36 AM  Other: Lindell Spar, NP 02/23/2017 10:36 AM  Other:  02/23/2017 10:36 AM  Other: 02/23/2017 10:36 AM  Scribe for Treatment Team: Georga Kaufmann, MSW,LCSWA 02/23/2017 10:36 AM

## 2017-02-23 NOTE — Progress Notes (Signed)
Patient did not attend wrap up group. 

## 2017-02-23 NOTE — Progress Notes (Signed)
[facility] MD Progress Note  02/23/2017 11:19 AM Cassie Cuevas  MRN:  [id number] Subjective:  Patient complains of significant abdominal pain , nausea, vomiting this AM.  Objective : I have discussed case with treatment team and have met with patient. Currently she presents alert, attentive, gait is slow, unsteady, due to which she is currently in wheel chair as fall precaution. She complains of abdominal pain, which she states is not a new issue for her, and describes history of intermittent abdominal pain and vomiting . She states " I have had pancreatitis in the past, and that is how it feels".  Of note, patient went to ED yesterday for similar complaints ( although states that pain is not epigastric today, but more located on lower abdominal area).Was medically cleared, pain was managed and returned to unit yesterday evening . Received Phenergan for nausea/vomiting ( patient states she uses this medication at home) Of note, 11/1 BMP unremarkable ( other than hyperglycemia),AST and ALT WNL, Lipase 30 11/1 EKG - QTc 507- EKG today QTc  532 Patient reports feeling depressed, anxious, which she mostly attributes to physical symptoms, denies SI at this time.    Principal Problem: Depression Diagnosis:   Patient Active Problem List   Diagnosis Date Noted  . MDD (major depressive disorder), recurrent episode, severe ([place]) [F33.2] 02/22/2017   Total Time spent with patient: 25 minutes   Past Medical History:  Past Medical History:  Diagnosis Date  . Diabetes mellitus without complication ([place])   . Hypercholesterolemia   . Hypertension   . IBS (irritable bowel syndrome)     Past Surgical History:  Procedure Laterality Date  . CHOLECYSTECTOMY    . HERNIA REPAIR  X2  . TOTAL ABDOMINAL HYSTERECTOMY     Family History: History reviewed. No pertinent family history. Social History:  History  Alcohol Use No     History  Drug Use No    Social History   Social History  . Marital status:  Legally Separated    Spouse name: N/A  . Number of children: N/A  . Years of education: N/A   Social History Main Topics  . Smoking status: Current Every Day Smoker    Packs/day: 1.00    Types: Cigarettes  . Smokeless tobacco: Never Used  . Alcohol use No  . Drug use: No  . Sexual activity: Not Asked   Other Topics Concern  . None   Social History Narrative  . None   Additional Social History:    Pain Medications: See MAR Prescriptions: See MAR Over the Counter: See MAR History of alcohol / drug use?: No history of alcohol / drug abuse Longest period of sobriety (when/how long): N/A  Sleep: Fair  Appetite:  Poor  Current Medications: Current Facility-Administered Medications  Medication Dose Route Frequency Provider Last Rate Last Dose  . acetaminophen (TYLENOL) tablet 650 mg  650 mg Oral Q6H PRN Patriciaann Clan E, PA-C      . albuterol (PROVENTIL HFA;VENTOLIN HFA) 108 (90 Base) MCG/ACT inhaler 2 puff  2 puff Inhalation Q6H PRN Laverle Hobby, PA-C      . alum & mag hydroxide-simeth (MAALOX/MYLANTA) 200-200-20 MG/5ML suspension 30 mL  30 mL Oral Q4H PRN Patriciaann Clan E, PA-C      . ARIPiprazole (ABILIFY) tablet 5 mg  5 mg Oral Daily Lanny Lipkin A, MD      . clindamycin (CLEOCIN) capsule 150 mg  150 mg Oral Q8H Simon, Spencer E, PA-C   150 mg at  02/23/17 7673  . diazepam (VALIUM) tablet 5 mg  5 mg Oral Q12H PRN Aeralyn Barna, Myer Peer, MD   5 mg at 02/22/17 2201  . fluticasone (FLONASE) 50 MCG/ACT nasal spray 2 spray  2 spray Each Nare Daily Laverle Hobby, PA-C   2 spray at 02/22/17 0835  . gabapentin (NEURONTIN) capsule 400 mg  400 mg Oral BID Kemara Quigley, Myer Peer, MD   400 mg at 02/22/17 2003  . Influenza vac split quadrivalent PF (FLUARIX) injection 0.5 mL  0.5 mL Intramuscular Tomorrow-1000 Kenzlie Disch A, MD      . insulin aspart (novoLOG) injection 0-15 Units  0-15 Units Subcutaneous TID WC Patriciaann Clan E, PA-C   5 Units at 02/22/17 1848  . insulin aspart  (novoLOG) injection 0-5 Units  0-5 Units Subcutaneous QHS Laverle Hobby, PA-C   0 Units at 02/22/17 0117  . insulin detemir (LEVEMIR) injection 20 Units  20 Units Subcutaneous QPM Florencia Reasons, MD   20 Units at 02/22/17 1849  . linagliptin (TRADJENTA) tablet 5 mg  5 mg Oral Daily Laverle Hobby, PA-C   5 mg at 02/22/17 0834  . losartan (COZAAR) tablet 100 mg  100 mg Oral Daily Laverle Hobby, PA-C   100 mg at 02/22/17 4193  . magnesium hydroxide (MILK OF MAGNESIA) suspension 30 mL  30 mL Oral Daily PRN Laverle Hobby, PA-C      . metFORMIN (GLUCOPHAGE) tablet 1,000 mg  1,000 mg Oral BID WC Patriciaann Clan E, PA-C   1,000 mg at 02/22/17 0834  . naproxen (NAPROSYN) tablet 500 mg  500 mg Oral BID PRN Laverle Hobby, PA-C   500 mg at 02/23/17 0239  . omega-3 acid ethyl esters (LOVAZA) capsule 1 g  1 g Oral BID Florencia Reasons, MD      . ondansetron (ZOFRAN-ODT) disintegrating tablet 8 mg  8 mg Oral Q8H PRN Lindon Romp A, NP   8 mg at 02/23/17 0121  . pantoprazole (PROTONIX) EC tablet 40 mg  40 mg Oral Daily Laverle Hobby, PA-C   40 mg at 02/22/17 0834  . PARoxetine (PAXIL) tablet 20 mg  20 mg Oral Daily Silvia Markuson, Myer Peer, MD   20 mg at 02/22/17 1216  . pneumococcal 23 valent vaccine (PNU-IMMUNE) injection 0.5 mL  0.5 mL Intramuscular Tomorrow-1000 Aidenjames Heckmann A, MD      . simvastatin (ZOCOR) tablet 40 mg  40 mg Oral Daily Patriciaann Clan E, PA-C   40 mg at 02/22/17 0834  . traMADol (ULTRAM) tablet 50 mg  50 mg Oral Q12H PRN Jaylynne Birkhead, Myer Peer, MD   50 mg at 02/22/17 2004  . traZODone (DESYREL) tablet 50 mg  50 mg Oral QHS PRN Dejane Scheibe, Myer Peer, MD   50 mg at 02/22/17 2200    Lab Results:  Results for orders placed or performed during the hospital encounter of 02/22/17 (from the past 48 hour(s))  Glucose, capillary     Status: Abnormal   Collection Time: 02/22/17 12:58 AM  Result Value Ref Range   Glucose-Capillary 197 (H) 65 - 99 mg/dL  Hemoglobin A1c     Status: Abnormal   Collection Time:  02/22/17  6:10 AM  Result Value Ref Range   Hgb A1c MFr Bld 7.7 (H) 4.8 - 5.6 %    Comment: (NOTE) Pre diabetes:          5.7%-6.4% Diabetes:              >6.4% Glycemic  control for   <7.0% adults with diabetes    Mean Plasma Glucose 174.29 mg/dL    Comment: Performed at [facility] [address], [place], [place] [address]  TSH     Status: None   Collection Time: 02/22/17  6:10 AM  Result Value Ref Range   TSH 0.571 0.350 - 4.500 uIU/mL    Comment: Performed by a 3rd Generation assay with a functional sensitivity of <=0.01 uIU/mL. Performed at [facility], [place] [address], [place], [place] [address]   Prolactin     Status: None   Collection Time: 02/22/17  6:10 AM  Result Value Ref Range   Prolactin 9.8 4.8 - 23.3 ng/mL    Comment: (NOTE) Performed At: [facility] [place], [place] [id number] Lindon Romp MD [id number] Performed at [facility], [place] [address], [place], [place] [address]   Lipid panel     Status: Abnormal   Collection Time: 02/22/17  6:10 AM  Result Value Ref Range   Cholesterol 222 (H) 0 - 200 mg/dL   Triglycerides 815 (H) <150 mg/dL   HDL 24 (L) >40 mg/dL   Total CHOL/HDL Ratio 9.3 RATIO   VLDL UNABLE TO CALCULATE IF TRIGLYCERIDE OVER 400 mg/dL 0 - 40 mg/dL   LDL Cholesterol UNABLE TO CALCULATE IF TRIGLYCERIDE OVER 400 mg/dL 0 - 99 mg/dL    Comment:        Total Cholesterol/HDL:CHD Risk Coronary Heart Disease Risk Table                     Men   Women  1/2 Average Risk   3.4   3.3  Average Risk       5.0   4.4  2 X Average Risk   9.6   7.1  3 X Average Risk  23.4   11.0        Use the calculated Patient Ratio above and the CHD Risk Table to determine the patient's CHD Risk.        ATP III CLASSIFICATION (LDL):  <100     mg/dL   Optimal  100-129  mg/dL   Near or Above                    Optimal  130-159  mg/dL   Borderline  160-189  mg/dL   High  >190     mg/dL    Very High Performed at [facility] [address] [place], [place] [address]   Glucose, capillary     Status: Abnormal   Collection Time: 02/22/17  6:23 AM  Result Value Ref Range   Glucose-Capillary 213 (H) 65 - 99 mg/dL   Comment 1 Notify RN   Glucose, capillary     Status: Abnormal   Collection Time: 02/22/17 11:56 AM  Result Value Ref Range   Glucose-Capillary 232 (H) 65 - 99 mg/dL  CBC with Differential     Status: Abnormal   Collection Time: 02/22/17  4:15 PM  Result Value Ref Range   WBC 13.0 (H) 4.0 - 10.5 K/uL   RBC 4.35 3.87 - 5.11 MIL/uL   Hemoglobin 14.6 12.0 - 15.0 g/dL   HCT 41.1 36.0 - 46.0 %   MCV 94.5 78.0 - 100.0 fL   MCH 33.6 26.0 - 34.0 pg   MCHC 35.5 30.0 - 36.0 g/dL   RDW 13.3 11.5 - 15.5 %   Platelets  256 150 - 400 K/uL   Neutrophils Relative % 74 %   Neutro Abs 9.6 (H) 1.7 - 7.7 K/uL   Lymphocytes Relative 21 %   Lymphs Abs 2.7 0.7 - 4.0 K/uL   Monocytes Relative 4 %   Monocytes Absolute 0.6 0.1 - 1.0 K/uL   Eosinophils Relative 1 %   Eosinophils Absolute 0.1 0.0 - 0.7 K/uL   Basophils Relative 0 %   Basophils Absolute 0.0 0.0 - 0.1 K/uL  Comprehensive metabolic panel     Status: Abnormal   Collection Time: 02/22/17  4:15 PM  Result Value Ref Range   Sodium 135 135 - 145 mmol/L   Potassium 3.7 3.5 - 5.1 mmol/L   Chloride 101 101 - 111 mmol/L   CO2 25 22 - 32 mmol/L   Glucose, Bld 268 (H) 65 - 99 mg/dL   BUN 12 6 - 20 mg/dL   Creatinine, Ser 0.95 0.44 - 1.00 mg/dL   Calcium 9.2 8.9 - 10.3 mg/dL   Total Protein 7.5 6.5 - 8.1 g/dL   Albumin 4.2 3.5 - 5.0 g/dL   AST 30 15 - 41 U/L   ALT 28 14 - 54 U/L   Alkaline Phosphatase 125 38 - 126 U/L   Total Bilirubin 0.7 0.3 - 1.2 mg/dL   GFR calc non Af Amer >60 >60 mL/min   GFR calc Af Amer >60 >60 mL/min    Comment: (NOTE) The eGFR has been calculated using the CKD EPI equation. This calculation has not been validated in all clinical situations. eGFR's persistently <60 mL/min signify  possible Chronic Kidney Disease.    Anion gap 9 5 - 15    Comment: Performed at [facility] [address] [place], [place] [address]  Lipase, blood     Status: None   Collection Time: 02/22/17  4:15 PM  Result Value Ref Range   Lipase 30 11 - 51 U/L    Comment: Performed at [facility] [address], [place],  [address]  I-stat troponin, ED     Status: None   Collection Time: 02/22/17  4:19 PM  Result Value Ref Range   Troponin i, poc 0.00 0.00 - 0.08 ng/mL   Comment 3            Comment: Due to the release kinetics of cTnI, a negative result within the first hours of the onset of symptoms does not rule out myocardial infarction with certainty. If myocardial infarction is still suspected, repeat the test at appropriate intervals.   I-stat Chem 8, ED     Status: Abnormal   Collection Time: 02/22/17  4:21 PM  Result Value Ref Range   Sodium 139 135 - 145 mmol/L   Potassium 3.8 3.5 - 5.1 mmol/L   Chloride 102 101 - 111 mmol/L   BUN 13 6 - 20 mg/dL   Creatinine, Ser 0.90 0.44 - 1.00 mg/dL   Glucose, Bld 266 (H) 65 - 99 mg/dL   Calcium, Ion 1.09 (L) 1.15 - 1.40 mmol/L   TCO2 27 22 - 32 mmol/L   Hemoglobin 14.3 12.0 - 15.0 g/dL   HCT 42.0 36.0 - 46.0 %  CBG monitoring, ED     Status: Abnormal   Collection Time: 02/22/17  5:49 PM  Result Value Ref Range   Glucose-Capillary 225 (H) 65 - 99 mg/dL  Urinalysis, Routine w reflex microscopic     Status: Abnormal   Collection Time: 02/22/17  7:43 PM  Result Value Ref Range   Color, Urine YELLOW YELLOW   APPearance CLEAR CLEAR   Specific Gravity, Urine >1.046 (H) 1.005 - 1.030   pH 5.0 5.0 - 8.0   Glucose, UA NEGATIVE NEGATIVE mg/dL   Hgb urine dipstick NEGATIVE NEGATIVE   Bilirubin Urine NEGATIVE NEGATIVE   Ketones, ur 5 (A) NEGATIVE mg/dL   Protein, ur NEGATIVE NEGATIVE mg/dL   Nitrite NEGATIVE NEGATIVE   Leukocytes, UA NEGATIVE NEGATIVE   RBC / HPF 0-5 0 - 5 RBC/hpf   WBC, UA 0-5 0 - 5 WBC/hpf    Bacteria, UA RARE (A) NONE SEEN   Squamous Epithelial / LPF 0-5 (A) NONE SEEN   Mucus PRESENT    Hyaline Casts, UA PRESENT   Glucose, capillary     Status: Abnormal   Collection Time: 02/22/17  9:37 PM  Result Value Ref Range   Glucose-Capillary 200 (H) 65 - 99 mg/dL  Glucose, capillary     Status: Abnormal   Collection Time: 02/23/17  5:36 AM  Result Value Ref Range   Glucose-Capillary 292 (H) 65 - 99 mg/dL   Comment 1 Notify RN     Blood Alcohol level:  No results found for: [facility]  Metabolic Disorder Labs: Lab Results  Component Value Date   HGBA1C 7.7 (H) 02/22/2017   MPG 174.29 02/22/2017   Lab Results  Component Value Date   PROLACTIN 9.8 02/22/2017   Lab Results  Component Value Date   CHOL 222 (H) 02/22/2017   TRIG 815 (H) 02/22/2017   HDL 24 (L) 02/22/2017   CHOLHDL 9.3 02/22/2017   VLDL UNABLE TO CALCULATE IF TRIGLYCERIDE OVER 400 mg/dL 02/22/2017   LDLCALC UNABLE TO CALCULATE IF TRIGLYCERIDE OVER 400 mg/dL 02/22/2017    Physical Findings: AIMS: Facial and Oral Movements Muscles of Facial Expression: None, normal Lips and Perioral Area: None, normal Jaw: None, normal Tongue: None, normal,Extremity Movements Upper (arms, wrists, hands, fingers): None, normal Lower (legs, knees, ankles, toes): None, normal, Trunk Movements Neck, shoulders, hips: None, normal, Overall Severity Severity of abnormal movements (highest score from questions above): None, normal Incapacitation due to abnormal movements: None, normal Patient's awareness of abnormal movements (rate only patient's report): No Awareness, Dental Status Current problems with teeth and/or dentures?: Yes Does patient usually wear dentures?: Yes  CIWA:    COWS:     Musculoskeletal: Strength & Muscle Tone: within normal limits Gait & Station: slow , unsteady at this time Patient leans: N/A  Psychiatric Specialty Exam: Physical Exam  ROS describes abdominal pain, nausea, vomiting , does not endorse  diarrhea, no fever .   Blood pressure (!) 179/86, pulse 85, temperature 98.2 F (36.8 C), temperature source Oral, resp. rate 16, height '5\' 3"'  (1.6 m), weight 60.8 kg (134 lb), SpO2 96 %.Body mass index is 23.74 kg/m.  General Appearance: Fairly Groomed  Eye Contact:  Fair  Speech:  Normal Rate  Volume:  Decreased  Mood:  Depressed  Affect:  constricted, anxious   Thought Process:  Linear and Descriptions of Associations: Intact  Orientation:  Other:  fully alert and attentive  Thought Content:  denies hallucinations, no delusions, not internally preoccupied   Suicidal Thoughts:  No denies suicidal or self injurious ideations at this time, and contracts for safety on unit   Homicidal Thoughts:  No denies homicidal ideations  Memory:  recent and remote grossly intact   Judgement:  Other:  fair  Insight:  Fair  Psychomotor Activity:  Normal- no significant distal tremors,  no diaphoresis  Concentration:  Concentration: Fair and Attention Span: Fair  Recall:  Good  Fund of Knowledge:  Good  Language:  Good  Akathisia:  Negative  Handed:  Right  AIMS (if indicated):     Assets:  Desire for Improvement Resilience  ADL's: fair  Cognition:  WNL  Sleep:  Number of Hours: 3   Assessment - patient presents depressed, constricted, but denies SI at this time. She is somatically focused and reporting abdominal pain, nausea and vomiting. These symptoms are not new, and states she has a history of nausea, vomiting for which she was taking Phenergan at home.  Was seen in ED yesterday for abdominal pain, which persists today. Patient reports long term treatment with Valium, which she was taking at 10 mgrs BID. At this time no tremors, no tachycardia, no diaphoresis or other symptoms of BZD withdrawal.  Of note, EKG is remarkable for prolonged QTc. Patient had been taking Phenergan, Paxil, which can be associated with prolonged QTc.  11/1 K+  I have discussed case with Dr. Erlinda Hong, Hospitalist. Diabetes  related gastroparesis is being considered as potential cause for GI symptoms. I have also reviewed case with Cardiology consultant- recommendation is to discontinue medications that may be associated with QTc prolongation and monitor EKG. No indication for telemetry at this time.   Treatment Plan Summary: Inpatient treatment Encourage ongoing group and milieu participation to work on coping skills and symptom reduction Increase Valium to 5 mgrs Q 8 hours for anxiety- hold if sedated  Increase Neurontin to 800 mgrs BID for pain , anxiety  Discontinue Phenergan, Zofran, Paxil, Ultram as these may be associated with QTc prolongation Consider Remeron for depression, anxiety, and may help with appetite and nausea. Would start at 7.5 mgrs QHS  Check BMP and Magnesium serum levels Repeat EKG in AM Appreciate Hospitalist consultation   Jenne Campus, MD 02/23/2017, 11:19 AM

## 2017-02-23 NOTE — Progress Notes (Signed)
Pt was up for most of the night complaining of abdominal pain, nausea and vomiting and irritability; "the doctor took me off a lot of my medication; I get 1600mg  of Neurontin per day and all I get now is 400mg ; that's not going to work for me."-see [facility] for administered PRNs. All patient's questions and concerns addressed. Support, encouragement, and safe environment provided.

## 2017-02-23 NOTE — Progress Notes (Signed)
Pt came on to the unit from ED on a w/c accompanied by MHT. Pt continue to complained of nausea; "they have taking me away from many of the medication I take at home. Will continue to monitor Pt for safety.

## 2017-02-23 NOTE — BH Assessment (Signed)
D: Patient isolates in her room this evening. Seen lying in bed appear drowsy. Patient's speech is slow and slurred, difficult for this writer understand patient. Patient states that she's still awake because she wants to get her bed time med. Patient refused to attend wrap up group tonight and refused snacks offered. CBG at bedtime was 209 mg/dl. 2 units of Novolog given per sliding scale. Patient verbalized no concern. No behavior issues noted thus far.   A: Staff offered support and encouragement as needed. Due med given as ordered. Routine safety checks maintained. Will continue to monitor patient.   R: Patient remains safe and fall free.

## 2017-02-23 NOTE — Progress Notes (Addendum)
Cassie Cuevas is observed this evening. She is not moaning and /or crying and she denies any nausea and /or vomiting. She has spent most of her time today resting quietly in her bed. A She was able to verbally answer her assessment questions ( and writer transcribed them) and although she rated her depression and hopelessness and anxiety " 8/7/9", respectively. She was not able to eat breakfast and or lunch today ( due to feelings of nausea with vomiting)  and ate 2 containers of yogurt at 1430. dinner cbg was 247 and pt given  20 units levimir and 5 units novolog per MD sliding scale. R Remians HIGH fall risk, encouraged pt to use call system. Safety in [place].

## 2017-02-23 NOTE — BHH Counselor (Signed)
CSW attempted to complete PSA with pt yesterday morning, pt stated that she was not feeling well and reported that she was having severe abdominal pain. Pt was taken to the ED shortly after. Pt has returned to [facility] today but is reporting still not feeling well. Pt continues to endorse severe abdominal pain. Pt will likely be transported back to the ED. CSW unable to complete PSA at this time. CSW will attempt PSA again when pt is medically cleared.   Georga Kaufmann, MSW, [place]

## 2017-02-23 NOTE — Progress Notes (Signed)
Inpatient Diabetes Program Recommendations  AACE/ADA: New Consensus Statement on Inpatient Glycemic Control (2015)  Target Ranges:  Prepandial:   less than 140 mg/dL      Peak postprandial:   less than 180 mg/dL (1-2 hours)      Critically ill patients:  140 - 180 mg/dL   Lab Results  Component Value Date   GLUCAP 292 (H) 02/23/2017   HGBA1C 7.7 (H) 02/22/2017   Review of Glycemic Control  Diabetes history: DM 2 Outpatient Diabetes medications: Amaryl 4 mg BID, Januvia 100 mg Daily, Metformin 1,500 mg QAM, 1000 mg QPM, Levemir 80, Novolog 20 BID with meals Current orders for Inpatient glycemic control: Levemir 20 units QHS, Novolog Moderate 0-15 units tid + Novolog HS scale 0-5 units, tradjenta 5 mg Daily  Inpatient Diabetes Program Recommendations:    Glucose levels elevated 292 this am.  Please consider Levemir 20 units BID.  Consider also Novolog 5 units tid meal coverage.  Thanks, Tama Headings RN, MSN, [facility] Inpatient Diabetes Coordinator Team Pager [phone] (8a-5p)

## 2017-02-24 LAB — BASIC METABOLIC PANEL
Anion gap: 14 (ref 5–15)
BUN: 17 mg/dL (ref 6–20)
CHLORIDE: 97 mmol/L — AB (ref 101–111)
CO2: 23 mmol/L (ref 22–32)
Calcium: 9.5 mg/dL (ref 8.9–10.3)
Creatinine, Ser: 0.76 mg/dL (ref 0.44–1.00)
GFR calc Af Amer: >60 mL/min (ref >60)
GFR calc non Af Amer: >60 mL/min (ref >60)
Glucose, Bld: 180 mg/dL — ABNORMAL HIGH (ref 65–99)
POTASSIUM: 3.5 mmol/L (ref 3.5–5.1)
SODIUM: 134 mmol/L — AB (ref 135–145)

## 2017-02-24 LAB — GLUCOSE, CAPILLARY
GLUCOSE-CAPILLARY: 225 mg/dL — AB (ref 65–99)
GLUCOSE-CAPILLARY: 219 mg/dL — AB (ref 65–99)
Glucose-Capillary: 199 mg/dL — ABNORMAL HIGH (ref 65–99)
Glucose-Capillary: 183 mg/dL — ABNORMAL HIGH (ref 65–99)

## 2017-02-24 LAB — MAGNESIUM: MAGNESIUM: 1.6 mg/dL — AB (ref 1.7–2.4)

## 2017-02-24 LAB — LIPASE, BLOOD: LIPASE: 30 U/L (ref 11–51)

## 2017-02-24 MED ORDER — MAGNESIUM OXIDE 400 (241.3 MG) MG PO TABS
400.0000 mg | ORAL_TABLET | Freq: Two times a day (BID) | ORAL | Status: DC
  Administered 2017-02-24 – 2017-02-25 (×2): 400 mg via ORAL
  Filled 2017-02-24 (×5): qty 1

## 2017-02-24 MED ORDER — CLONIDINE HCL 0.1 MG PO TABS
0.1000 mg | ORAL_TABLET | Freq: Once | ORAL | Status: AC
  Administered 2017-02-24: 0.1 mg via ORAL
  Filled 2017-02-24 (×2): qty 1

## 2017-02-24 MED ORDER — POTASSIUM CHLORIDE CRYS ER 20 MEQ PO TBCR
40.0000 meq | EXTENDED_RELEASE_TABLET | Freq: Once | ORAL | Status: AC
  Administered 2017-02-24: 40 meq via ORAL
  Filled 2017-02-24 (×2): qty 2

## 2017-02-24 NOTE — Progress Notes (Signed)
DAR Note: Patient continues to isolate in her room despite much encouragement to attend group and socialize with peers on dayroom. Per report, patient has not been eating her meals. When this writer asked patient about not eating her meals,   Patient stated "I am not hungry. I have food in my stomach." staff encouraged patient to eat by offering snacks and drinks. Patient ate I cup fruit bowl and apple juice. Patient's CBG at bedtime was 199 mg/dl. 0 coverage per sliding scale. Patient BP elevated this evening 162/102 mg/dl. Corene Cornea NP notified - ordered 0.1 mg of Clonidine. Patient accepted. BP to be rechecked after 1 hour. Will continue to monitor patient.

## 2017-02-24 NOTE — Plan of Care (Signed)
Problem: Activity: Goal: Imbalance in normal sleep/wake cycle will improve Outcome: Not Progressing Pt only out of bed for meds, and to dayroom briefly

## 2017-02-24 NOTE — Progress Notes (Signed)
Pt sitting in dayroom in w/c drinking ginger ale- pt still complaining of nausea, but reports that the valium has helped. Pt does refuse her vaccines at this time, stating, "I don't want to feel worse."

## 2017-02-24 NOTE — Progress Notes (Signed)
[facility] MD Progress Note  02/24/2017 12:50 PM Cassie Cuevas  MRN:  [id number] Subjective:  Patient acknowledges some improvement in mood, but states she remains depressed, which she attributes in part to persistent nausea and abdominal discomfort. At this time denies suicidal ideations. No medication side effects.  Objective : I have discussed case with treatment team and have met with patient. Patient presents with partial improvement in mood and affect compared to admission, but still depressed, constricted, and remains somatically focused . She does report she is feeling better than on admission and today has been more active, less isolative, more visible in day room. As she improves she is becoming more future oriented, and expressed interest in what her expected length of stay on unit is . States appetite is fair. Remains nauseous, but denies vomiting. 11/3 EKG QTc 514, Magnesium low at 1.6    Principal Problem: Depression Diagnosis:   Patient Active Problem List   Diagnosis Date Noted  . MDD (major depressive disorder), recurrent episode, severe ([place]) [F33.2] 02/22/2017   Total Time spent with patient: 25 minutes   Past Medical History:  Past Medical History:  Diagnosis Date  . Diabetes mellitus without complication ([place])   . Hypercholesterolemia   . Hypertension   . IBS (irritable bowel syndrome)     Past Surgical History:  Procedure Laterality Date  . CHOLECYSTECTOMY    . HERNIA REPAIR  X2  . TOTAL ABDOMINAL HYSTERECTOMY     Family History: History reviewed. No pertinent family history. Social History:  History  Alcohol Use No     History  Drug Use No    Social History   Social History  . Marital status: Legally Separated    Spouse name: N/A  . Number of children: N/A  . Years of education: N/A   Social History Main Topics  . Smoking status: Current Every Day Smoker    Packs/day: 1.00    Types: Cigarettes  . Smokeless tobacco: Never Used  . Alcohol use No   . Drug use: No  . Sexual activity: Not Asked   Other Topics Concern  . None   Social History Narrative  . None   Additional Social History:    Pain Medications: See MAR Prescriptions: See MAR Over the Counter: See MAR History of alcohol / drug use?: No history of alcohol / drug abuse Longest period of sobriety (when/how long): N/A  Sleep: improving   Appetite:  Fair  Current Medications: Current Facility-Administered Medications  Medication Dose Route Frequency Provider Last Rate Last Dose  . acetaminophen (TYLENOL) tablet 650 mg  650 mg Oral Q6H PRN Laverle Hobby, PA-C   650 mg at 02/24/17 6073  . albuterol (PROVENTIL HFA;VENTOLIN HFA) 108 (90 Base) MCG/ACT inhaler 2 puff  2 puff Inhalation Q6H PRN Laverle Hobby, PA-C      . alum & mag hydroxide-simeth (MAALOX/MYLANTA) 200-200-20 MG/5ML suspension 30 mL  30 mL Oral Q4H PRN Patriciaann Clan E, PA-C      . clindamycin (CLEOCIN) capsule 150 mg  150 mg Oral Q8H Simon, Spencer E, PA-C   150 mg at 02/24/17 0608  . diazepam (VALIUM) tablet 5 mg  5 mg Oral Q8H PRN Kataleena Holsapple, Myer Peer, MD   5 mg at 02/24/17 0828  . fluticasone (FLONASE) 50 MCG/ACT nasal spray 2 spray  2 spray Each Nare Daily Laverle Hobby, PA-C   2 spray at 02/24/17 7106  . gabapentin (NEURONTIN) capsule 800 mg  800 mg Oral BID Jhovany Weidinger,  Myer Peer, MD   800 mg at 02/24/17 0825  . Influenza vac split quadrivalent PF (FLUARIX) injection 0.5 mL  0.5 mL Intramuscular Tomorrow-1000 Dannilynn Gallina, Myer Peer, MD   Stopped at 02/24/17 1119  . insulin aspart (novoLOG) injection 0-15 Units  0-15 Units Subcutaneous TID WC Patriciaann Clan E, PA-C   5 Units at 02/24/17 1237  . insulin aspart (novoLOG) injection 0-5 Units  0-5 Units Subcutaneous QHS Laverle Hobby, PA-C   2 Units at 02/23/17 2200  . insulin aspart (novoLOG) injection 3 Units  3 Units Subcutaneous TID WC Florencia Reasons, MD   3 Units at 02/24/17 [id number]  . insulin detemir (LEVEMIR) injection 15 Units  15 Units Subcutaneous BID Florencia Reasons, MD   15 Units at 02/24/17 0827  . linagliptin (TRADJENTA) tablet 5 mg  5 mg Oral Daily Laverle Hobby, PA-C   5 mg at 02/24/17 3545  . losartan (COZAAR) tablet 100 mg  100 mg Oral Daily Laverle Hobby, PA-C   100 mg at 02/24/17 0606  . magnesium hydroxide (MILK OF MAGNESIA) suspension 30 mL  30 mL Oral Daily PRN Patriciaann Clan E, PA-C      . magnesium oxide (MAG-OX) tablet 400 mg  400 mg Oral Daily Florencia Reasons, MD   400 mg at 02/24/17 0826  . metFORMIN (GLUCOPHAGE) tablet 1,000 mg  1,000 mg Oral BID WC Patriciaann Clan E, PA-C   1,000 mg at 02/24/17 6256  . mirtazapine (REMERON) tablet 7.5 mg  7.5 mg Oral QHS Rosemarie Galvis, Myer Peer, MD   7.5 mg at 02/23/17 2105  . omega-3 acid ethyl esters (LOVAZA) capsule 1 g  1 g Oral BID Florencia Reasons, MD      . pantoprazole (PROTONIX) EC tablet 40 mg  40 mg Oral Daily Laverle Hobby, PA-C   40 mg at 02/24/17 0826  . pneumococcal 23 valent vaccine (PNU-IMMUNE) injection 0.5 mL  0.5 mL Intramuscular Tomorrow-1000 Shelsey Rieth, Myer Peer, MD   Stopped at 02/24/17 1120  . simvastatin (ZOCOR) tablet 40 mg  40 mg Oral Daily Laverle Hobby, PA-C   40 mg at 02/24/17 3893    Lab Results:  Results for orders placed or performed during the hospital encounter of 02/22/17 (from the past 48 hour(s))  CBC with Differential     Status: Abnormal   Collection Time: 02/22/17  4:15 PM  Result Value Ref Range   WBC 13.0 (H) 4.0 - 10.5 K/uL   RBC 4.35 3.87 - 5.11 MIL/uL   Hemoglobin 14.6 12.0 - 15.0 g/dL   HCT 41.1 36.0 - 46.0 %   MCV 94.5 78.0 - 100.0 fL   MCH 33.6 26.0 - 34.0 pg   MCHC 35.5 30.0 - 36.0 g/dL   RDW 13.3 11.5 - 15.5 %   Platelets 256 150 - 400 K/uL   Neutrophils Relative % 74 %   Neutro Abs 9.6 (H) 1.7 - 7.7 K/uL   Lymphocytes Relative 21 %   Lymphs Abs 2.7 0.7 - 4.0 K/uL   Monocytes Relative 4 %   Monocytes Absolute 0.6 0.1 - 1.0 K/uL   Eosinophils Relative 1 %   Eosinophils Absolute 0.1 0.0 - 0.7 K/uL   Basophils Relative 0 %   Basophils Absolute 0.0 0.0  - 0.1 K/uL  Comprehensive metabolic panel     Status: Abnormal   Collection Time: 02/22/17  4:15 PM  Result Value Ref Range   Sodium 135 135 - 145 mmol/L   Potassium 3.7 3.5 -  5.1 mmol/L   Chloride 101 101 - 111 mmol/L   CO2 25 22 - 32 mmol/L   Glucose, Bld 268 (H) 65 - 99 mg/dL   BUN 12 6 - 20 mg/dL   Creatinine, Ser 0.95 0.44 - 1.00 mg/dL   Calcium 9.2 8.9 - 10.3 mg/dL   Total Protein 7.5 6.5 - 8.1 g/dL   Albumin 4.2 3.5 - 5.0 g/dL   AST 30 15 - 41 U/L   ALT 28 14 - 54 U/L   Alkaline Phosphatase 125 38 - 126 U/L   Total Bilirubin 0.7 0.3 - 1.2 mg/dL   GFR calc non Af Amer >60 >60 mL/min   GFR calc Af Amer >60 >60 mL/min    Comment: (NOTE) The eGFR has been calculated using the CKD EPI equation. This calculation has not been validated in all clinical situations. eGFR's persistently <60 mL/min signify possible Chronic Kidney Disease.    Anion gap 9 5 - 15    Comment: Performed at [facility] [address], [place], [place] [address]  Lipase, blood     Status: None   Collection Time: 02/22/17  4:15 PM  Result Value Ref Range   Lipase 30 11 - 51 U/L    Comment: Performed at [facility] [address] [place], [place] [address]  I-stat troponin, ED     Status: None   Collection Time: 02/22/17  4:19 PM  Result Value Ref Range   Troponin i, poc 0.00 0.00 - 0.08 ng/mL   Comment 3            Comment: Due to the release kinetics of cTnI, a negative result within the first hours of the onset of symptoms does not rule out myocardial infarction with certainty. If myocardial infarction is still suspected, repeat the test at appropriate intervals.   I-stat Chem 8, ED     Status: Abnormal   Collection Time: 02/22/17  4:21 PM  Result Value Ref Range   Sodium 139 135 - 145 mmol/L   Potassium 3.8 3.5 - 5.1 mmol/L   Chloride 102 101 - 111 mmol/L   BUN 13 6 - 20 mg/dL   Creatinine, Ser 0.90 0.44 - 1.00 mg/dL   Glucose, Bld 266 (H) 65 - 99 mg/dL   Calcium, Ion 1.09  (L) 1.15 - 1.40 mmol/L   TCO2 27 22 - 32 mmol/L   Hemoglobin 14.3 12.0 - 15.0 g/dL   HCT 42.0 36.0 - 46.0 %  CBG monitoring, ED     Status: Abnormal   Collection Time: 02/22/17  5:49 PM  Result Value Ref Range   Glucose-Capillary 225 (H) 65 - 99 mg/dL  Urinalysis, Routine w reflex microscopic     Status: Abnormal   Collection Time: 02/22/17  7:43 PM  Result Value Ref Range   Color, Urine YELLOW YELLOW   APPearance CLEAR CLEAR   Specific Gravity, Urine >1.046 (H) 1.005 - 1.030   pH 5.0 5.0 - 8.0   Glucose, UA NEGATIVE NEGATIVE mg/dL   Hgb urine dipstick NEGATIVE NEGATIVE   Bilirubin Urine NEGATIVE NEGATIVE   Ketones, ur 5 (A) NEGATIVE mg/dL   Protein, ur NEGATIVE NEGATIVE mg/dL   Nitrite NEGATIVE NEGATIVE   Leukocytes, UA NEGATIVE NEGATIVE   RBC / HPF 0-5 0 - 5 RBC/hpf   WBC, UA 0-5 0 - 5 WBC/hpf   Bacteria, UA RARE (A) NONE SEEN   Squamous Epithelial / LPF 0-5 (A) NONE SEEN   Mucus  PRESENT    Hyaline Casts, UA PRESENT   Glucose, capillary     Status: Abnormal   Collection Time: 02/22/17  9:37 PM  Result Value Ref Range   Glucose-Capillary 200 (H) 65 - 99 mg/dL  Glucose, capillary     Status: Abnormal   Collection Time: 02/23/17  5:36 AM  Result Value Ref Range   Glucose-Capillary 292 (H) 65 - 99 mg/dL   Comment 1 Notify RN   Glucose, capillary     Status: Abnormal   Collection Time: 02/23/17 12:13 PM  Result Value Ref Range   Glucose-Capillary 268 (H) 65 - 99 mg/dL  Glucose, capillary     Status: Abnormal   Collection Time: 02/23/17  5:34 PM  Result Value Ref Range   Glucose-Capillary 247 (H) 65 - 99 mg/dL   Comment 1 Notify RN    Comment 2 Document in Chart   Glucose, capillary     Status: Abnormal   Collection Time: 02/23/17  8:57 PM  Result Value Ref Range   Glucose-Capillary 209 (H) 65 - 99 mg/dL   Comment 1 Notify RN   Glucose, capillary     Status: Abnormal   Collection Time: 02/24/17  5:41 AM  Result Value Ref Range   Glucose-Capillary 225 (H) 65 - 99  mg/dL   Comment 1 Notify RN   Basic metabolic panel     Status: Abnormal   Collection Time: 02/24/17  7:47 AM  Result Value Ref Range   Sodium 134 (L) 135 - 145 mmol/L   Potassium 3.5 3.5 - 5.1 mmol/L   Chloride 97 (L) 101 - 111 mmol/L   CO2 23 22 - 32 mmol/L   Glucose, Bld 180 (H) 65 - 99 mg/dL   BUN 17 6 - 20 mg/dL   Creatinine, Ser 0.76 0.44 - 1.00 mg/dL   Calcium 9.5 8.9 - 10.3 mg/dL   GFR calc non Af Amer >60 >60 mL/min   GFR calc Af Amer >60 >60 mL/min    Comment: (NOTE) The eGFR has been calculated using the CKD EPI equation. This calculation has not been validated in all clinical situations. eGFR's persistently <60 mL/min signify possible Chronic Kidney Disease.    Anion gap 14 5 - 15    Comment: Performed at [facility], [place] [address], [place], [place] [address]  Magnesium     Status: Abnormal   Collection Time: 02/24/17  7:47 AM  Result Value Ref Range   Magnesium 1.6 (L) 1.7 - 2.4 mg/dL    Comment: Performed at [facility], [place] [address], [place], [place] [address]  Lipase, blood     Status: None   Collection Time: 02/24/17  7:47 AM  Result Value Ref Range   Lipase 30 11 - 51 U/L    Comment: Performed at [facility], [place] [address] [place], [place] [address]  Glucose, capillary     Status: Abnormal   Collection Time: 02/24/17 11:48 AM  Result Value Ref Range   Glucose-Capillary 219 (H) 65 - 99 mg/dL   Comment 1 Notify RN    Comment 2 Document in Chart     Blood Alcohol level:  No results found for: [facility]  Metabolic Disorder Labs: Lab Results  Component Value Date   HGBA1C 7.7 (H) 02/22/2017   MPG 174.29 02/22/2017   Lab Results  Component Value Date   PROLACTIN 9.8 02/22/2017   Lab Results  Component Value Date   CHOL 222 (H)  02/22/2017   TRIG 815 (H) 02/22/2017   HDL 24 (L) 02/22/2017   CHOLHDL 9.3 02/22/2017   VLDL UNABLE TO CALCULATE IF TRIGLYCERIDE OVER 400 mg/dL 02/22/2017    LDLCALC UNABLE TO CALCULATE IF TRIGLYCERIDE OVER 400 mg/dL 02/22/2017    Physical Findings: AIMS: Facial and Oral Movements Muscles of Facial Expression: None, normal Lips and Perioral Area: None, normal Jaw: None, normal Tongue: None, normal,Extremity Movements Upper (arms, wrists, hands, fingers): None, normal Lower (legs, knees, ankles, toes): None, normal, Trunk Movements Neck, shoulders, hips: None, normal, Overall Severity Severity of abnormal movements (highest score from questions above): None, normal Incapacitation due to abnormal movements: None, normal Patient's awareness of abnormal movements (rate only patient's report): No Awareness, Dental Status Current problems with teeth and/or dentures?: Yes Does patient usually wear dentures?: Yes  CIWA:    COWS:     Musculoskeletal: Strength & Muscle Tone: within normal limits Gait & Station: improving  Patient leans: N/A  Psychiatric Specialty Exam: Physical Exam  ROS describes abdominal pain, nausea- reports these symptoms are chronic .   Blood pressure (!) 176/84, pulse 87, temperature 98.2 F (36.8 C), temperature source Oral, resp. rate 16, height _0  (1.6 m), weight 60.8 kg (134 lb), SpO2 96 %.Body mass index is 23.74 kg/m.  General Appearance: improving grooming   Eye Contact:  Fair- improving   Speech:  Normal Rate  Volume:  Decreased  Mood:  remains depressed, but states she is feeling partially better today  Affect:  remains constricted   Thought Process:  Linear and Descriptions of Associations: Intact  Orientation:  Other:  fully alert and attentive  Thought Content:  denies hallucinations, no delusions, not internally preoccupied   Suicidal Thoughts:  No denies suicidal or self injurious ideations at this time, and contracts for safety on unit   Homicidal Thoughts:  No denies homicidal ideations  Memory:  recent and remote grossly intact   Judgement:  Other:  fair  Insight:  Fair  Psychomotor Activity:   Normal- no significant distal tremors, no diaphoresis  Concentration:  Concentration: Good and Attention Span: Good  Recall:  Good  Fund of Knowledge:  Good  Language:  Good  Akathisia:  Negative  Handed:  Right  AIMS (if indicated):     Assets:  Desire for Improvement Resilience  ADL's: fair  Cognition:  WNL  Sleep:  Number of Hours: 6.75   Assessment - patient presents with partially improved mood and affect . Denies suicidal ideations. Remains somatically focused and reports chronic nausea which is an ongoing symptoms, but denies vomiting today and states she has been able to eat " a little more".  QTc interval remains elevated, K+ 3.5, Magnesium 1.6 - I have reviewed case with hosptialist consultant, Dr. Sherrian Divers, and recommendation is to provide KDUR x 1 and continue Magnesium supplementation   Treatment Plan Summary: Treatment Plan reviewed as below today 11/3  Encourage ongoing group and milieu participation to work on coping skills and symptom reduction Continue  Valium 5 mgrs Q 8 hours for anxiety- hold if sedated  Continue  Neurontin 800 mgrs BID for pain , anxiety  KDUR 40 mEq x 1  And Magensium 400 mgrs BID for electrolyte imbalance  Continue  Remeron 7.5 mgrs QHS  for depression, anxiety, and may help with appetite and nausea.  Will request PT consult for evaluation of gait, fall risk and potential need for assisting device  Treatment team working on disposition [place]  Malachai Schalk, MD 02/24/2017, 12:50 PM   Patient ID: Cassie Cuevas, female   DOB: Aug 28, 1955, 61 y.o.   MRN: [id number]

## 2017-02-24 NOTE — Progress Notes (Signed)
Patient did not attend wrap up group. 

## 2017-02-24 NOTE — Progress Notes (Signed)
Pt resting in bed- doesn't want to go outside with group, but reports relief from nausea and pain.

## 2017-02-24 NOTE — Progress Notes (Signed)
Patient complained of headache of 6/10 on waking up. Accepted PRN of Acetaminophen 650 mg. Has elevated BP of 179/114. Scheduled Losartan 100 mg given per [facility]. Corene Cornea NP notified. To recheck patient's BP in 30 mins then I hr.   Patient's CBG this morning 225 mg/dl. Got 7 units of Novolog per sliding scale. Patient in her at this time. No distress noted. Will continue to monitor patient.

## 2017-02-24 NOTE — BHH Group Notes (Signed)
[facility] Group Notes: (Clinical Social Work)   02/24/2017      Type of Therapy:  Group Therapy   Participation Level:  Did Not Attend despite MHT prompting   Selmer Dominion, LCSW 02/24/2017, 12:36 PM

## 2017-02-24 NOTE — Progress Notes (Signed)
D. Pt in w/c at med window upon initial approach. Pt presents with a sad affect with congruent mood, complaining of anxiety. .  Pt is calm and cooperative and currently denies SI/HI and AVH. Pt agrees to contact staff before acting on any harmful thoughts.  A. Labs and vitals monitored. Pt given and educated on medications. Pt supported emotionally and encouraged to express concerns and ask questions.   R. Pt remains safe with 15 minute checks. Will continue POC.

## 2017-02-24 NOTE — BHH Group Notes (Signed)
Life SKills   Date:  02/24/2017  Time:  1330  Type of Therapy:  Nurse Education  Participation Level:   Patient did not attend  Participation Quality:   Affect:    Cognitive:    Insight:    Engagement in Group:    Modes of Intervention:    Summary of Progress/Problems:  Lauralyn Primes 02/24/2017, 3:05 PM

## 2017-02-24 NOTE — BHH Group Notes (Signed)
Life SKills   Date:  02/24/2017  Time:  1330  Type of Therapy:  Life Skills :  The group focuses on teaching patients how to identify their needs and then how to develop the skills needed to get their needs met.  Participation Level: Patient did not attend.   Participation Quality:    Affect:    Cognitive:    Insight:    Engagement in Group:    Modes of Intervention:    Summary of Progress/Problems:  Lauralyn Primes 02/24/2017, 3:23 PM
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
## 2017-02-25 ENCOUNTER — Encounter (HOSPITAL_COMMUNITY): Payer: Self-pay | Admitting: *Deleted

## 2017-02-25 ENCOUNTER — Other Ambulatory Visit: Payer: Self-pay

## 2017-02-25 ENCOUNTER — Encounter (HOSPITAL_COMMUNITY): Payer: Self-pay | Admitting: Emergency Medicine

## 2017-02-25 ENCOUNTER — Observation Stay (HOSPITAL_COMMUNITY)
Admission: AD | Admit: 2017-02-25 | Discharge: 2017-02-26 | Disposition: A | Discharge status: Home / Self Care | Payer: Medicare PPO | Source: Ambulatory Visit | Attending: Internal Medicine | Admitting: Internal Medicine

## 2017-02-25 ENCOUNTER — Inpatient Hospital Stay (HOSPITAL_COMMUNITY): Payer: Medicare PPO

## 2017-02-25 ENCOUNTER — Observation Stay (HOSPITAL_COMMUNITY): Payer: Medicare PPO

## 2017-02-25 DIAGNOSIS — E7800 Pure hypercholesterolemia, unspecified: Secondary | ICD-10-CM | POA: Insufficient documentation

## 2017-02-25 DIAGNOSIS — E1149 Type 2 diabetes mellitus with other diabetic neurological complication: Secondary | ICD-10-CM

## 2017-02-25 DIAGNOSIS — Z8719 Personal history of other diseases of the digestive system: Secondary | ICD-10-CM | POA: Insufficient documentation

## 2017-02-25 DIAGNOSIS — F419 Anxiety disorder, unspecified: Secondary | ICD-10-CM | POA: Insufficient documentation

## 2017-02-25 DIAGNOSIS — Z91048 Other nonmedicinal substance allergy status: Secondary | ICD-10-CM | POA: Insufficient documentation

## 2017-02-25 DIAGNOSIS — E860 Dehydration: Secondary | ICD-10-CM

## 2017-02-25 DIAGNOSIS — R1110 Vomiting, unspecified: Secondary | ICD-10-CM | POA: Diagnosis not present

## 2017-02-25 DIAGNOSIS — F332 Major depressive disorder, recurrent severe without psychotic features: Secondary | ICD-10-CM | POA: Diagnosis present

## 2017-02-25 DIAGNOSIS — E785 Hyperlipidemia, unspecified: Secondary | ICD-10-CM | POA: Insufficient documentation

## 2017-02-25 DIAGNOSIS — Z8249 Family history of ischemic heart disease and other diseases of the circulatory system: Secondary | ICD-10-CM | POA: Insufficient documentation

## 2017-02-25 DIAGNOSIS — F17210 Nicotine dependence, cigarettes, uncomplicated: Secondary | ICD-10-CM | POA: Insufficient documentation

## 2017-02-25 DIAGNOSIS — I700 Atherosclerosis of aorta: Secondary | ICD-10-CM | POA: Insufficient documentation

## 2017-02-25 DIAGNOSIS — Z7951 Long term (current) use of inhaled steroids: Secondary | ICD-10-CM | POA: Insufficient documentation

## 2017-02-25 DIAGNOSIS — I10 Essential (primary) hypertension: Secondary | ICD-10-CM | POA: Insufficient documentation

## 2017-02-25 DIAGNOSIS — Z90710 Acquired absence of both cervix and uterus: Secondary | ICD-10-CM | POA: Insufficient documentation

## 2017-02-25 DIAGNOSIS — R079 Chest pain, unspecified: Secondary | ICD-10-CM | POA: Diagnosis present

## 2017-02-25 DIAGNOSIS — Z9049 Acquired absence of other specified parts of digestive tract: Secondary | ICD-10-CM | POA: Insufficient documentation

## 2017-02-25 DIAGNOSIS — R0789 Other chest pain: Secondary | ICD-10-CM | POA: Insufficient documentation

## 2017-02-25 DIAGNOSIS — N281 Cyst of kidney, acquired: Secondary | ICD-10-CM | POA: Insufficient documentation

## 2017-02-25 DIAGNOSIS — Z880 Allergy status to penicillin: Secondary | ICD-10-CM | POA: Insufficient documentation

## 2017-02-25 DIAGNOSIS — Z794 Long term (current) use of insulin: Secondary | ICD-10-CM | POA: Insufficient documentation

## 2017-02-25 DIAGNOSIS — R9431 Abnormal electrocardiogram [ECG] [EKG]: Secondary | ICD-10-CM

## 2017-02-25 DIAGNOSIS — R197 Diarrhea, unspecified: Secondary | ICD-10-CM

## 2017-02-25 DIAGNOSIS — Z79899 Other long term (current) drug therapy: Secondary | ICD-10-CM | POA: Insufficient documentation

## 2017-02-25 DIAGNOSIS — Z9889 Other specified postprocedural states: Secondary | ICD-10-CM | POA: Insufficient documentation

## 2017-02-25 DIAGNOSIS — K580 Irritable bowel syndrome with diarrhea: Secondary | ICD-10-CM | POA: Insufficient documentation

## 2017-02-25 LAB — I-STAT TROPONIN, ED: Troponin i, poc: 0.01 ng/mL (ref 0.00–0.08)

## 2017-02-25 LAB — CBC WITH DIFFERENTIAL/PLATELET
BASOS ABS: 0 10*3/uL (ref 0.0–0.1)
Basophils Relative: 0 %
EOS PCT: 0 %
Eosinophils Absolute: 0.1 10*3/uL (ref 0.0–0.7)
HEMATOCRIT: 48.2 % — AB (ref 36.0–46.0)
Hemoglobin: 16.9 g/dL — ABNORMAL HIGH (ref 12.0–15.0)
LYMPHS PCT: 19 %
Lymphs Abs: 3.7 10*3/uL (ref 0.7–4.0)
MCH: 33.2 pg (ref 26.0–34.0)
MCHC: 35.1 g/dL (ref 30.0–36.0)
MCV: 94.7 fL (ref 78.0–100.0)
Monocytes Absolute: 1.2 10*3/uL — ABNORMAL HIGH (ref 0.1–1.0)
Monocytes Relative: 6 %
NEUTROS ABS: 15 10*3/uL — AB (ref 1.7–7.7)
NEUTROS PCT: 75 %
PLATELETS: 360 10*3/uL (ref 150–400)
RBC: 5.09 MIL/uL (ref 3.87–5.11)
RDW: 13.6 % (ref 11.5–15.5)
WBC: 20.1 10*3/uL — AB (ref 4.0–10.5)

## 2017-02-25 LAB — BASIC METABOLIC PANEL
ANION GAP: 13 (ref 5–15)
BUN: 28 mg/dL — ABNORMAL HIGH (ref 6–20)
CO2: 24 mmol/L (ref 22–32)
Calcium: 9.9 mg/dL (ref 8.9–10.3)
Chloride: 98 mmol/L — ABNORMAL LOW (ref 101–111)
Creatinine, Ser: 1.02 mg/dL — ABNORMAL HIGH (ref 0.44–1.00)
GFR, EST NON AFRICAN AMERICAN: 59 mL/min — AB (ref >60)
Glucose, Bld: 197 mg/dL — ABNORMAL HIGH (ref 65–99)
POTASSIUM: 4.7 mmol/L (ref 3.5–5.1)
SODIUM: 135 mmol/L (ref 135–145)

## 2017-02-25 LAB — CBG MONITORING, ED: Glucose-Capillary: 199 mg/dL — ABNORMAL HIGH (ref 65–99)

## 2017-02-25 LAB — CREATININE, SERUM
CREATININE: 0.94 mg/dL (ref 0.44–1.00)
GFR calc Af Amer: >60 mL/min (ref >60)

## 2017-02-25 LAB — CBC
HCT: 46.4 % — ABNORMAL HIGH (ref 36.0–46.0)
HEMOGLOBIN: 16.2 g/dL — AB (ref 12.0–15.0)
MCH: 32.8 pg (ref 26.0–34.0)
MCHC: 34.9 g/dL (ref 30.0–36.0)
MCV: 93.9 fL (ref 78.0–100.0)
PLATELETS: 319 10*3/uL (ref 150–400)
RBC: 4.94 MIL/uL (ref 3.87–5.11)
RDW: 13.4 % (ref 11.5–15.5)
WBC: 19.6 10*3/uL — ABNORMAL HIGH (ref 4.0–10.5)

## 2017-02-25 LAB — HEMOGLOBIN A1C
HEMOGLOBIN A1C: 7.9 % — AB (ref 4.8–5.6)
Mean Plasma Glucose: 180.03 mg/dL

## 2017-02-25 LAB — GLUCOSE, CAPILLARY
GLUCOSE-CAPILLARY: 251 mg/dL — AB (ref 65–99)
Glucose-Capillary: 183 mg/dL — ABNORMAL HIGH (ref 65–99)

## 2017-02-25 LAB — TROPONIN I

## 2017-02-25 LAB — LIPASE, BLOOD: Lipase: 30 U/L (ref 11–51)

## 2017-02-25 LAB — MAGNESIUM: MAGNESIUM: 2 mg/dL (ref 1.7–2.4)

## 2017-02-25 MED ORDER — ENOXAPARIN SODIUM 40 MG/0.4ML ~~LOC~~ SOLN: 40.0000 mg | SUBCUTANEOUS | Status: DC

## 2017-02-25 MED ORDER — GI COCKTAIL ~~LOC~~: 30.0000 mL | Freq: Three times a day (TID) | ORAL | Status: DC

## 2017-02-25 MED ORDER — SODIUM CHLORIDE 0.9% FLUSH
3.0000 mL | Freq: Two times a day (BID) | INTRAVENOUS | Status: DC
  Administered 2017-02-25 – 2017-02-26 (×2): 3 mL via INTRAVENOUS

## 2017-02-25 MED ORDER — PANTOPRAZOLE SODIUM 40 MG PO TBEC
40.0000 mg | DELAYED_RELEASE_TABLET | Freq: Every day | ORAL | Status: DC
  Administered 2017-02-25 – 2017-02-26 (×2): 40 mg via ORAL
  Filled 2017-02-25 (×2): qty 1

## 2017-02-25 MED ORDER — ASPIRIN EC 81 MG PO TBEC: 81.0000 mg | DELAYED_RELEASE_TABLET | Freq: Every day | ORAL | Status: DC

## 2017-02-25 MED ORDER — SODIUM CHLORIDE 0.9 % IV BOLUS (SEPSIS): 500.0000 mL | Freq: Once | INTRAVENOUS | Status: DC

## 2017-02-25 MED ORDER — IOPAMIDOL (ISOVUE-370) INJECTION 76%
INTRAVENOUS | Status: AC
  Administered 2017-02-25: 100 mL via INTRAVENOUS
  Filled 2017-02-25: qty 100

## 2017-02-25 MED ORDER — IOPAMIDOL (ISOVUE-300) INJECTION 61%
INTRAVENOUS | Status: AC
  Filled 2017-02-25: qty 100

## 2017-02-25 MED ORDER — GI COCKTAIL ~~LOC~~: 30.0000 mL | Freq: Three times a day (TID) | ORAL | Status: DC | PRN

## 2017-02-25 MED ORDER — INSULIN DETEMIR 100 UNIT/ML ~~LOC~~ SOLN: 10.0000 [IU] | Freq: Every day | SUBCUTANEOUS | Status: DC

## 2017-02-25 MED ORDER — TRAMADOL HCL 50 MG PO TABS: 50.0000 mg | ORAL_TABLET | Freq: Four times a day (QID) | ORAL | Status: DC

## 2017-02-25 MED ORDER — HEPARIN BOLUS VIA INFUSION
3000.0000 [IU] | Freq: Once | INTRAVENOUS | Status: AC
  Administered 2017-02-25: 3000 [IU] via INTRAVENOUS
  Filled 2017-02-25: qty 3000

## 2017-02-25 MED ORDER — ASPIRIN 81 MG PO CHEW
324.0000 mg | CHEWABLE_TABLET | ORAL | Status: AC
  Administered 2017-02-25: 324 mg via ORAL
  Filled 2017-02-25: qty 4

## 2017-02-25 MED ORDER — SUCRALFATE 1 GM/10ML PO SUSP: 1.0000 g | Freq: Four times a day (QID) | ORAL | Status: DC

## 2017-02-25 MED ORDER — ONDANSETRON HCL 4 MG PO TABS: 4.0000 mg | ORAL_TABLET | Freq: Four times a day (QID) | ORAL | Status: DC | PRN

## 2017-02-25 MED ORDER — LORAZEPAM 2 MG/ML IJ SOLN
0.5000 mg | Freq: Once | INTRAMUSCULAR | Status: AC
  Administered 2017-02-25: 0.5 mg via INTRAVENOUS
  Filled 2017-02-25: qty 1

## 2017-02-25 MED ORDER — TRAMADOL HCL 50 MG PO TABS
50.0000 mg | ORAL_TABLET | Freq: Four times a day (QID) | ORAL | Status: DC
  Administered 2017-02-25 – 2017-02-26 (×3): 50 mg via ORAL
  Filled 2017-02-25 (×4): qty 1

## 2017-02-25 MED ORDER — POTASSIUM CHLORIDE IN NACL 20-0.45 MEQ/L-% IV SOLN: INTRAVENOUS | Status: DC

## 2017-02-25 MED ORDER — LABETALOL HCL 5 MG/ML IV SOLN
20.0000 mg | INTRAVENOUS | Status: DC | PRN
  Administered 2017-02-25: 20 mg via INTRAVENOUS
  Filled 2017-02-25: qty 4

## 2017-02-25 MED ORDER — ASPIRIN EC 325 MG PO TBEC
DELAYED_RELEASE_TABLET | ORAL | Status: AC
  Filled 2017-02-25: qty 1

## 2017-02-25 MED ORDER — INSULIN ASPART 100 UNIT/ML ~~LOC~~ SOLN: 0.0000 [IU] | Freq: Four times a day (QID) | SUBCUTANEOUS | Status: DC

## 2017-02-25 MED ORDER — SODIUM CHLORIDE 0.9 % IV SOLN: 250.0000 mL | INTRAVENOUS | Status: DC | PRN

## 2017-02-25 MED ORDER — ONDANSETRON HCL 4 MG/2ML IJ SOLN: 4.0000 mg | Freq: Four times a day (QID) | INTRAMUSCULAR | Status: DC | PRN

## 2017-02-25 MED ORDER — SODIUM CHLORIDE 0.9% FLUSH: 3.0000 mL | INTRAVENOUS | Status: DC | PRN

## 2017-02-25 MED ORDER — ASPIRIN 300 MG RE SUPP: 300.0000 mg | RECTAL | Status: AC

## 2017-02-25 MED ORDER — MORPHINE SULFATE (PF) 4 MG/ML IV SOLN
4.0000 mg | Freq: Once | INTRAVENOUS | Status: DC
  Filled 2017-02-25: qty 1

## 2017-02-25 MED ORDER — NITROGLYCERIN 0.4 MG SL SUBL
SUBLINGUAL_TABLET | SUBLINGUAL | Status: AC
  Administered 2017-02-25: 0.4 mg
  Filled 2017-02-25: qty 1

## 2017-02-25 MED ORDER — NITROGLYCERIN 0.4 MG SL SUBL: 0.4000 mg | SUBLINGUAL_TABLET | SUBLINGUAL | Status: DC | PRN

## 2017-02-25 MED ORDER — ONDANSETRON HCL 4 MG/2ML IJ SOLN
4.0000 mg | Freq: Four times a day (QID) | INTRAMUSCULAR | Status: DC | PRN
  Administered 2017-02-25: 4 mg via INTRAVENOUS
  Filled 2017-02-25: qty 2

## 2017-02-25 MED ORDER — ACETAMINOPHEN 325 MG PO TABS: 650.0000 mg | ORAL_TABLET | ORAL | Status: DC | PRN

## 2017-02-25 MED ORDER — HEPARIN (PORCINE) IN NACL 100-0.45 UNIT/ML-% IJ SOLN
900.0000 [IU]/h | INTRAMUSCULAR | Status: DC
  Administered 2017-02-25: 750 [IU]/h via INTRAVENOUS
  Filled 2017-02-25: qty 250

## 2017-02-25 MED ORDER — MORPHINE SULFATE (PF) 4 MG/ML IV SOLN: 2.0000 mg | INTRAVENOUS | Status: DC | PRN

## 2017-02-25 NOTE — ED Notes (Signed)
No SI/HI noted.

## 2017-02-25 NOTE — Progress Notes (Signed)
Patient came from [facility] to [facility] ED for medical clearance due to reporting chest pain. Patient is being treated for this matter at this time. Psychiatry to continue following patient, patient will return to [facility] at discharge.  CSW signing off.  Madilyn Fireman, MSW, LCSW-A Weekend Clinical Social Worker [phone]

## 2017-02-25 NOTE — Progress Notes (Signed)
[facility] MD Progress Note  02/25/2017 10:46 AM Cassie Cuevas  MRN:  [id number] Subjective:  Patient reports abdominal and chest pain, as well as subjective shortness of breath Objective : I have discussed case with treatment team and have met with patient. At about 9,15 AM patient complained of abdominal pain, nausea ( which are chronic issues for her), but also reported subjective dyspnea  and severe precordial chest pain, which she described as pressure in nature. VS 152/99 , pulse 130, O2Sat 99 at room air. CBG 189 EKG done- tachycardia , no ST elevation noted . Patient remained awake,but responded to only some questions,  reporting severe pain, moaning and unable to stay still .  She was given SL Nitro x 1, and 911 was called. EMS arrived about 5-10 minutes later, evaluated patient and she was  taken to [facility] ED .     Principal Problem: Depression Diagnosis:   Patient Active Problem List   Diagnosis Date Noted  . MDD (major depressive disorder), recurrent episode, severe ([place]) [F33.2] 02/22/2017   Total Time spent with patient: 20 minutes   Past Medical History:  Past Medical History:  Diagnosis Date  . Diabetes mellitus without complication ([place])   . Hypercholesterolemia   . Hypertension   . IBS (irritable bowel syndrome)     Past Surgical History:  Procedure Laterality Date  . CHOLECYSTECTOMY    . HERNIA REPAIR  X2  . TOTAL ABDOMINAL HYSTERECTOMY     Family History: History reviewed. No pertinent family history. Social History:  Social History   Substance and Sexual Activity  Alcohol Use No     Social History   Substance and Sexual Activity  Drug Use No    Social History   Socioeconomic History  . Marital status: Legally Separated    Spouse name: None  . Number of children: None  . Years of education: None  . Highest education level: None  Social Needs  . Financial resource strain: None  . Food insecurity - worry: None  . Food insecurity - inability: None  .  Transportation needs - medical: None  . Transportation needs - non-medical: None  Occupational History  . None  Tobacco Use  . Smoking status: Current Every Day Smoker    Packs/day: 1.00    Types: Cigarettes  . Smokeless tobacco: Never Used  Substance and Sexual Activity  . Alcohol use: No  . Drug use: No  . Sexual activity: None  Other Topics Concern  . None  Social History Narrative  . None   Additional Social History:    Pain Medications: See MAR Prescriptions: See MAR Over the Counter: See MAR History of alcohol / drug use?: No history of alcohol / drug abuse Longest period of sobriety (when/how long): N/A  Sleep: improving   Appetite:  Fair  Current Medications: Current Facility-Administered Medications  Medication Dose Route Frequency Provider Last Rate Last Dose  . aspirin EC 325 MG tablet           . acetaminophen (TYLENOL) tablet 650 mg  650 mg Oral Q6H PRN Laverle Hobby, PA-C   650 mg at 02/24/17 1405  . albuterol (PROVENTIL HFA;VENTOLIN HFA) 108 (90 Base) MCG/ACT inhaler 2 puff  2 puff Inhalation Q6H PRN Laverle Hobby, PA-C      . alum & mag hydroxide-simeth (MAALOX/MYLANTA) 200-200-20 MG/5ML suspension 30 mL  30 mL Oral Q4H PRN Patriciaann Clan E, PA-C      . clindamycin (CLEOCIN) capsule 150 mg  150 mg Oral Q8H Patriciaann Clan E, PA-C   150 mg at 02/25/17 4259  . diazepam (VALIUM) tablet 5 mg  5 mg Oral Q8H PRN Betheny Suchecki, Myer Peer, MD   5 mg at 02/25/17 0416  . fluticasone (FLONASE) 50 MCG/ACT nasal spray 2 spray  2 spray Each Nare Daily Laverle Hobby, PA-C   2 spray at 02/25/17 0813  . gabapentin (NEURONTIN) capsule 800 mg  800 mg Oral BID Ketty Bitton, Myer Peer, MD   800 mg at 02/25/17 0814  . Influenza vac split quadrivalent PF (FLUARIX) injection 0.5 mL  0.5 mL Intramuscular Tomorrow-1000 Nakari Bracknell, Myer Peer, MD   Stopped at 02/24/17 1119  . insulin aspart (novoLOG) injection 0-15 Units  0-15 Units Subcutaneous TID WC Patriciaann Clan E, PA-C   8 Units at  02/25/17 5638  . insulin aspart (novoLOG) injection 0-5 Units  0-5 Units Subcutaneous QHS Laverle Hobby, PA-C   2 Units at 02/23/17 2200  . insulin aspart (novoLOG) injection 3 Units  3 Units Subcutaneous TID WC Florencia Reasons, MD   3 Units at 02/25/17 [phone]  . insulin detemir (LEVEMIR) injection 15 Units  15 Units Subcutaneous BID Florencia Reasons, MD   15 Units at 02/25/17 0859  . linagliptin (TRADJENTA) tablet 5 mg  5 mg Oral Daily Laverle Hobby, PA-C   5 mg at 02/25/17 0815  . losartan (COZAAR) tablet 100 mg  100 mg Oral Daily Laverle Hobby, PA-C   100 mg at 02/25/17 3329  . magnesium hydroxide (MILK OF MAGNESIA) suspension 30 mL  30 mL Oral Daily PRN Patriciaann Clan E, PA-C      . magnesium oxide (MAG-OX) tablet 400 mg  400 mg Oral BID Lissett Favorite, Myer Peer, MD   400 mg at 02/25/17 0815  . metFORMIN (GLUCOPHAGE) tablet 1,000 mg  1,000 mg Oral BID WC Patriciaann Clan E, PA-C   1,000 mg at 02/25/17 5188  . mirtazapine (REMERON) tablet 7.5 mg  7.5 mg Oral QHS Demico Ploch, Myer Peer, MD   7.5 mg at 02/24/17 2136  . omega-3 acid ethyl esters (LOVAZA) capsule 1 g  1 g Oral BID Florencia Reasons, MD      . pantoprazole (PROTONIX) EC tablet 40 mg  40 mg Oral Daily Laverle Hobby, PA-C   40 mg at 02/25/17 0815  . pneumococcal 23 valent vaccine (PNU-IMMUNE) injection 0.5 mL  0.5 mL Intramuscular Tomorrow-1000 Abram Sax, Myer Peer, MD   Stopped at 02/24/17 1120  . simvastatin (ZOCOR) tablet 40 mg  40 mg Oral Daily Laverle Hobby, PA-C   40 mg at 02/25/17 4166   Current Outpatient Medications  Medication Sig Dispense Refill  . ARIPiprazole (ABILIFY) 10 MG tablet Take 10 mg by mouth daily.     . diazepam (VALIUM) 10 MG tablet Take 10 mg by mouth 2 (two) times daily.     Marland Kitchen esomeprazole (NEXIUM) 40 MG capsule Take 40 mg by mouth daily.     . fluticasone (FLONASE) 50 MCG/ACT nasal spray Place 2 sprays into both nostrils daily as needed for allergies. For allergies     . gabapentin (NEURONTIN) 800 MG tablet Take 800-1,600 tablets by  mouth 3 (three) times daily. Take one tablet by mouth twice daily and take two tablets at bed time    . glimepiride (AMARYL) 4 MG tablet Take 4 mg by mouth 2 (two) times daily.  5  . HYDROcodone-acetaminophen (NORCO) 7.5-325 MG tablet Take 1 tablet by mouth 2 (two) times daily.    Marland Kitchen  losartan (COZAAR) 100 MG tablet Take 100 mg by mouth daily.     . metFORMIN (GLUCOPHAGE) 500 MG tablet Take 1,000-1,500 tablets by mouth 2 (two) times daily. Take 3 tablets by mouth each morning and 2 tablets by mouth each evening.    Marland Kitchen PARoxetine (PAXIL) 30 MG tablet Take 60 mg by mouth daily.    . promethazine (PHENERGAN) 25 MG tablet Take 25 mg by mouth every 4 (four) hours as needed.  0  . simvastatin (ZOCOR) 40 MG tablet Take 40 mg by mouth daily.     . traMADol (ULTRAM) 50 MG tablet Take 50 mg by mouth every 6 (six) hours as needed (For pain.). For fibromyalgia pain    . VENTOLIN HFA 108 (90 Base) MCG/ACT inhaler Inhale 2 puffs into the lungs every 4 (four) hours as needed for wheezing or shortness of breath. wheezing    . dicyclomine (BENTYL) 20 MG tablet Take 1 tablet (20 mg total) by mouth 2 (two) times daily. 20 tablet 0  . JANUVIA 100 MG tablet Take 100 mg by mouth daily.     Marland Kitchen LEVEMIR FLEXTOUCH 100 UNIT/ML Pen Inject 80 Units into the skin every evening.    . meclizine (ANTIVERT) 25 MG tablet Take 1 tablet (25 mg total) by mouth 3 (three) times daily as needed for nausea. 30 tablet 0  . NOVOLOG FLEXPEN 100 UNIT/ML FlexPen Inject 20 Units into the skin 2 (two) times daily before a meal.      Lab Results:  Results for orders placed or performed during the hospital encounter of 02/22/17 (from the past 48 hour(s))  Glucose, capillary     Status: Abnormal   Collection Time: 02/23/17 12:13 PM  Result Value Ref Range   Glucose-Capillary 268 (H) 65 - 99 mg/dL  Glucose, capillary     Status: Abnormal   Collection Time: 02/23/17  5:34 PM  Result Value Ref Range   Glucose-Capillary 247 (H) 65 - 99 mg/dL    Comment 1 Notify RN    Comment 2 Document in Chart   Glucose, capillary     Status: Abnormal   Collection Time: 02/23/17  8:57 PM  Result Value Ref Range   Glucose-Capillary 209 (H) 65 - 99 mg/dL   Comment 1 Notify RN   Glucose, capillary     Status: Abnormal   Collection Time: 02/24/17  5:41 AM  Result Value Ref Range   Glucose-Capillary 225 (H) 65 - 99 mg/dL   Comment 1 Notify RN   Basic metabolic panel     Status: Abnormal   Collection Time: 02/24/17  7:47 AM  Result Value Ref Range   Sodium 134 (L) 135 - 145 mmol/L   Potassium 3.5 3.5 - 5.1 mmol/L   Chloride 97 (L) 101 - 111 mmol/L   CO2 23 22 - 32 mmol/L   Glucose, Bld 180 (H) 65 - 99 mg/dL   BUN 17 6 - 20 mg/dL   Creatinine, Ser 0.76 0.44 - 1.00 mg/dL   Calcium 9.5 8.9 - 10.3 mg/dL   GFR calc non Af Amer >60 >60 mL/min   GFR calc Af Amer >60 >60 mL/min    Comment: (NOTE) The eGFR has been calculated using the CKD EPI equation. This calculation has not been validated in all clinical situations. eGFR's persistently <60 mL/min signify possible Chronic Kidney Disease.    Anion gap 14 5 - 15    Comment: Performed at [facility], [address] [place], [place]  [address]  Magnesium     Status: Abnormal   Collection Time: 02/24/17  7:47 AM  Result Value Ref Range   Magnesium 1.6 (L) 1.7 - 2.4 mg/dL    Comment: Performed at [facility], [place] [address], [place], [place] [address]  Lipase, blood     Status: None   Collection Time: 02/24/17  7:47 AM  Result Value Ref Range   Lipase 30 11 - 51 U/L    Comment: Performed at [facility], [place] [address] [place], [place] [address]  Glucose, capillary     Status: Abnormal   Collection Time: 02/24/17 11:48 AM  Result Value Ref Range   Glucose-Capillary 219 (H) 65 - 99 mg/dL   Comment 1 Notify RN    Comment 2 Document in Chart   Glucose, capillary     Status: Abnormal   Collection Time: 02/24/17  5:05 PM  Result Value  Ref Range   Glucose-Capillary 199 (H) 65 - 99 mg/dL  Glucose, capillary     Status: Abnormal   Collection Time: 02/24/17  8:41 PM  Result Value Ref Range   Glucose-Capillary 183 (H) 65 - 99 mg/dL   Comment 1 Notify RN   Glucose, capillary     Status: Abnormal   Collection Time: 02/25/17  5:52 AM  Result Value Ref Range   Glucose-Capillary 251 (H) 65 - 99 mg/dL   Comment 1 Notify RN    Comment 2 Document in Chart   Glucose, capillary     Status: Abnormal   Collection Time: 02/25/17  9:22 AM  Result Value Ref Range   Glucose-Capillary 183 (H) 65 - 99 mg/dL    Blood Alcohol level:  No results found for: [facility]  Metabolic Disorder Labs: Lab Results  Component Value Date   HGBA1C 7.7 (H) 02/22/2017   MPG 174.29 02/22/2017   Lab Results  Component Value Date   PROLACTIN 9.8 02/22/2017   Lab Results  Component Value Date   CHOL 222 (H) 02/22/2017   TRIG 815 (H) 02/22/2017   HDL 24 (L) 02/22/2017   CHOLHDL 9.3 02/22/2017   VLDL UNABLE TO CALCULATE IF TRIGLYCERIDE OVER 400 mg/dL 02/22/2017   LDLCALC UNABLE TO CALCULATE IF TRIGLYCERIDE OVER 400 mg/dL 02/22/2017    Physical Findings: AIMS: Facial and Oral Movements Muscles of Facial Expression: None, normal Lips and Perioral Area: None, normal Jaw: None, normal Tongue: None, normal,Extremity Movements Upper (arms, wrists, hands, fingers): None, normal Lower (legs, knees, ankles, toes): None, normal, Trunk Movements Neck, shoulders, hips: None, normal, Overall Severity Severity of abnormal movements (highest score from questions above): None, normal Incapacitation due to abnormal movements: None, normal Patient's awareness of abnormal movements (rate only patient's report): No Awareness, Dental Status Current problems with teeth and/or dentures?: Yes Does patient usually wear dentures?: Yes  CIWA:    COWS:     Musculoskeletal: Strength & Muscle Tone: within normal limits Gait & Station: improving  Patient leans:  N/A  Psychiatric Specialty Exam: Physical Exam  ROS describes abdominal pain, nausea- reports these symptoms are chronic .  As above, today reports chest pain and dyspnea   Blood pressure (!) 168/99, pulse (!) 102, temperature 98.7 F (37.1 C), temperature source Oral, resp. rate 18, height '5\' 3"'  (1.6 m), weight 60.8 kg (134 lb), SpO2 98 %.Body mass index is 23.74 kg/m.  General Appearance: Fairly Groomed  Eye Contact:  Fair  Speech:  decreased   Volume:  Decreased  Mood:  Anxious  Affect:  Restricted  Thought Process:  Linear and Descriptions of Associations: Intact  Orientation:  Other:  fully alert and attentive  Thought Content:  denies hallucinations, no delusions, not internally preoccupied   Suicidal Thoughts:  No denies suicidal or self injurious ideations at this time, and contracts for safety on unit   Homicidal Thoughts:  No denies homicidal ideations  Memory:  recent and remote grossly intact   Judgement:  Other:  fair  Insight:  Fair  Psychomotor Activity:  vaguely restless  no diaphoresis noted   Concentration:  Concentration: Good and Attention Span: Good  Recall:  Good  Fund of Knowledge:  Good  Language:  Good  Akathisia:  Negative  Handed:  Right  AIMS (if indicated):     Assets:  Desire for Improvement Resilience  ADL's: fair  Cognition:  WNL  Sleep:  Number of Hours: 6.5   Assessment - patient reports severe abdominal and chest pain, described as pressure and precordial, as well as subjective dyspnea, although pulse ox at room air remained at 99. Regarding mood, remains depressed, vaguely anxious, but denies SI.   Treatment Plan Summary: Treatment Plan reviewed as below today 11/4  Due to above physical symptoms, patient was taken via ambulance to [facility] ED for further evaluation and treatment     Jenne Campus, MD 02/25/2017, 10:46 AM   Patient ID: Cassie Cuevas, female   DOB: 08/25/1955, 61 y.o.   MRN: [id number]

## 2017-02-25 NOTE — ED Triage Notes (Signed)
Pt to ER as transfer from [facility] for evaluation of acute onset of central/left chest pressure onset 2-3 hours ago while "sitting and laying." reports associated shortness of breath and nausea, also reports epigastric pain. States has been compliant with medications, states her providers have decreased the dosage of "most all of my medicines." EMS reports on their arrival patient was in [place] @ 130 bpm, on arrival to ER HR is 102. Pt a/o x4.

## 2017-02-25 NOTE — ED Notes (Signed)
Admitting MD at bedside.

## 2017-02-25 NOTE — Progress Notes (Signed)
Approx 0915 Patient hit  call bell at her bedside. Writer responded in 4-6 seconds. Pt observed slouched over on her left side , breathing  skin warm and dry, patient said" I can't get my breath and my chest hurts really bad" . RN notified MD and other staff [place] aware    VS 152/99 RR 16 02 sat  99 % RA MD at bedside ekg in process pt complaining of chest pain  Left side  patient somnolent diff to arouse. Speaks drowsy .reports to MD left side chest pain " for a few hours". Pt denies prior hx of MI, denies hx clots, 911 called, cbg obtained 183 911 arrived and pt put on monitor verbal reprot called to [facility] pt cont aler but drowsy HR 130 0935 pt to [place] via 911 per MD  Chest pain cont

## 2017-02-25 NOTE — H&P (Addendum)
History and Physical    Kelly Ranieri KDX:[id number] DOB: [birth year] DOA: (Not on file)    PCP: [name], MD  [facility]  Chief Complaint: I was called by the ER to admit this patient as a chest pain  HPI: Cassie Cuevas is a 61 y.o. female with medical history of DM 2 uncontrolled unable to take medications, nausea/ vomiting and abdominal pain (? Gastroparesis), HTN and depression who was admitted to the [facility] for treatment of depression. She states she has been vomiting and having diarrhea for the past 4 days while there. She has not eaten anything today and has not urinated today. She states that her symptoms are typically not this severe at home. She takes Ultram occasionally for pain and not routinely. She has Hydrocodone mentioned on her home med list as well which she states she has not taken in over a month. Her husband confirms this.   ED Course: BP noted to be as high as 196/11, HR 100s, RR in 20-30s  Review of Systems:  All other systems reviewed and apart from HPI, are negative.  Past Medical History:  Diagnosis Date  . Diabetes mellitus without complication ([place])   . Hypercholesterolemia   . Hypertension   . IBS (irritable bowel syndrome)     Past Surgical History:  Procedure Laterality Date  . CHOLECYSTECTOMY    . HERNIA REPAIR  X2  . TOTAL ABDOMINAL HYSTERECTOMY      Social History:   reports that she has been smoking cigarettes.  She has been smoking about 1.00 pack per day. she has never used smokeless tobacco. She reports that she does not drink alcohol or use drugs.  Allergies  Allergen Reactions  . Penicillins Nausea And Vomiting, Rash and Other (See Comments)    Has patient had a PCN reaction causing immediate rash, facial/tongue/throat swelling, SOB or lightheadedness with hypotension: yes Has patient had a PCN reaction causing severe rash involving mucus membranes or skin necrosis: no Has patient had a  PCN reaction that required hospitalization: no Has patient had a PCN reaction occurring within the last 10 years: yes If all of the above answers are "NO", then may proceed with Cephalosporin use.    . Tape Rash and Other (See Comments)    needs paper tape     Family History  Problem Relation Age of Onset  . Heart Problems Mother 81       CABG     Prior to Admission medications   Medication Sig Start Date End Date Taking? Authorizing Provider  ARIPiprazole (ABILIFY) 10 MG tablet Take 10 mg by mouth daily.  07/27/15   [provider]  diazepam (VALIUM) 10 MG tablet Take 10 mg by mouth 2 (two) times daily.  07/28/15   [provider]  dicyclomine (BENTYL) 20 MG tablet Take 1 tablet (20 mg total) by mouth 2 (two) times daily. 02/22/17   Margarita Mail, PA-C  esomeprazole (NEXIUM) 40 MG capsule Take 40 mg by mouth daily.  08/13/15   [provider]  fluticasone (FLONASE) 50 MCG/ACT nasal spray Place 2 sprays into both nostrils daily as needed for allergies. For allergies  08/13/15   [provider]  gabapentin (NEURONTIN) 800 MG tablet Take 800-1,600 tablets by mouth 3 (three) times daily. Take one tablet by mouth twice daily and take two tablets at bed time 07/26/15   [provider]  glimepiride (AMARYL) 4 MG tablet Take 4 mg by mouth  2 (two) times daily. 01/26/17   [provider]  HYDROcodone-acetaminophen (NORCO) 7.5-325 MG tablet Take 1 tablet by mouth 2 (two) times daily. 07/28/15   [provider]  JANUVIA 100 MG tablet Take 100 mg by mouth daily.  07/26/15   [provider]  LEVEMIR FLEXTOUCH 100 UNIT/ML Pen Inject 80 Units into the skin every evening. 07/26/15   [provider]  losartan (COZAAR) 100 MG tablet Take 100 mg by mouth daily.  07/28/15   [provider]  meclizine (ANTIVERT) 25 MG tablet Take 1 tablet (25 mg total) by mouth 3 (three) times daily as needed for nausea. 02/22/17   Margarita Mail,  PA-C  metFORMIN (GLUCOPHAGE) 500 MG tablet Take 1,000-1,500 tablets by mouth 2 (two) times daily. Take 3 tablets by mouth each morning and 2 tablets by mouth each evening. 06/14/15   [provider]  NOVOLOG FLEXPEN 100 UNIT/ML FlexPen Inject 20 Units into the skin 2 (two) times daily before a meal. 07/26/15   [provider]  PARoxetine (PAXIL) 30 MG tablet Take 60 mg by mouth daily. 06/11/12   [provider]  promethazine (PHENERGAN) 25 MG tablet Take 25 mg by mouth every 4 (four) hours as needed. 01/22/17   [provider]  simvastatin (ZOCOR) 40 MG tablet Take 40 mg by mouth daily.  07/27/15   [provider]  traMADol (ULTRAM) 50 MG tablet Take 50 mg by mouth every 6 (six) hours as needed (For pain.). For fibromyalgia pain 07/28/15   [provider]  VENTOLIN HFA 108 (90 Base) MCG/ACT inhaler Inhale 2 puffs into the lungs every 4 (four) hours as needed for wheezing or shortness of breath. wheezing 08/09/15   [provider]    Physical Exam: Wt Readings from Last 3 Encounters:  02/22/17 60.8 kg (134 lb)  10/23/15 66.2 kg (146 lb)   There were no vitals filed for this visit.    Constitutional: NAD, uncomfortable and in pain Eyes: PERTLA, lids and conjunctivae normal ENMT: Mucous membranes are dry. Posterior pharynx clear of any exudate or lesions. Normal dentition.  Neck: normal, supple, no masses, no thyromegaly Respiratory: clear to auscultation bilaterally, no wheezing, no crackles. Normal respiratory effort. No accessory muscle use.  Cardiovascular: S1 & S2 heard, regular rate and rhythm, no murmurs / rubs / gallops. No extremity edema. 2+ pedal pulses. No carotid bruits.  Abdomen: No distension, diffuse tenderness, no rebound tenderness, no masses palpated. No hepatosplenomegaly. Bowel sounds normal.  Musculoskeletal: no clubbing / cyanosis. No joint deformity upper and lower extremities. Good ROM, no contractures. Normal muscle  tone.  Skin: no rashes, lesions, ulcers. No induration Neurologic: CN 2-12 grossly intact. Sensation intact, DTR normal. Strength 5/5 in all 4 limbs.  Psychiatric: Normal judgment and insight. Alert and oriented x 3. Appears anxious/ depressed    Labs on Admission: I have personally reviewed following labs and imaging studies  CBC: Recent Labs  Lab 02/22/17 1615 02/22/17 1621 02/25/17 1714  WBC 13.0*  --  20.1*  NEUTROABS 9.6*  --  15.0*  HGB 14.6 14.3 16.9*  HCT 41.1 42.0 48.2*  MCV 94.5  --  94.7  PLT 256  --  237   Basic Metabolic Panel: Recent Labs  Lab 02/22/17 1615 02/22/17 1621 02/24/17 0747 02/25/17 1437  NA 135 139 134*  --   K 3.7 3.8 3.5  --   CL 101 102 97*  --   CO2 25  --  23  --  GLUCOSE 268* 266* 180*  --   BUN 12 13 17   --   CREATININE 0.95 0.90 0.76  --   CALCIUM 9.2  --  9.5  --   MG  --   --  1.6* 2.0   GFR: Estimated Creatinine Clearance: 62.6 mL/min (by C-G formula based on SCr of 0.76 mg/dL). Liver Function Tests: Recent Labs  Lab 02/22/17 1615  AST 30  ALT 28  ALKPHOS 125  BILITOT 0.7  PROT 7.5  ALBUMIN 4.2   Recent Labs  Lab 02/22/17 1615 02/24/17 0747  LIPASE 30 30   No results for input(s): AMMONIA in the last 168 hours. Coagulation Profile: No results for input(s): INR, PROTIME in the last 168 hours. Cardiac Enzymes: No results for input(s): CKTOTAL, CKMB, CKMBINDEX, TROPONINI in the last 168 hours. BNP (last 3 results) No results for input(s): PROBNP in the last 8760 hours. HbA1C: No results for input(s): HGBA1C in the last 72 hours. CBG: Recent Labs  Lab 02/24/17 1705 02/24/17 2041 02/25/17 0552 02/25/17 0922 02/25/17 1331  GLUCAP 199* 183* 251* 183* 199*   Lipid Profile: No results for input(s): CHOL, HDL, LDLCALC, TRIG, CHOLHDL, LDLDIRECT in the last 72 hours. Thyroid Function Tests: No results for input(s): TSH, T4TOTAL, FREET4, T3FREE, THYROIDAB in the last 72 hours. Anemia Panel: No results for  input(s): VITAMINB12, FOLATE, FERRITIN, TIBC, IRON, RETICCTPCT in the last 72 hours. Urine analysis:    Component Value Date/Time   COLORURINE YELLOW 02/22/2017 1943   APPEARANCEUR CLEAR 02/22/2017 1943   LABSPEC >1.046 (H) 02/22/2017 1943   PHURINE 5.0 02/22/2017 1943   GLUCOSEU NEGATIVE 02/22/2017 [place] NEGATIVE 02/22/2017 [place] NEGATIVE 02/22/2017 1943   KETONESUR 5 (A) 02/22/2017 1943   PROTEINUR NEGATIVE 02/22/2017 1943   NITRITE NEGATIVE 02/22/2017 1943   LEUKOCYTESUR NEGATIVE 02/22/2017 1943   Sepsis Labs: @LABRCNTIP (procalcitonin:4,lacticidven:4) )No results found for this or any previous visit (from the past 240 hour(s)).   Radiological Exams on Admission: Dg Chest 2 View  Result Date: 02/25/2017 CLINICAL DATA:  61 year old female with chest pain and shortness of breath for 1 day. EXAM: CHEST  2 VIEW COMPARISON:  11/04/2016 FINDINGS: The heart size and mediastinal contours are within normal limits. A new opacity in the right upper lung has suggestive of a developing infiltrate. Previously noted right basilar opacity no longer identified. The left lung is clear. No sizeable effusions or pneumothorax. The visualized skeletal structures are unremarkable. IMPRESSION: 1. Interval development of a right upper lobe opacity suggestive of developing infiltrate. Continued follow-up recommended. 2. Interval resolution of right basilar opacity. Electronically Signed   By: Kristopher Oppenheim M.D.   On: 02/25/2017 11:58   Ct Angio Chest Pe W And/or Wo Contrast  Result Date: 02/25/2017 CLINICAL DATA:  Pt to ER as transfer from [facility] for evaluation of acute onset of central/left chest pressure onset 2-3 hours ago while "sitting and laying." reports associated shortness of breath and nausea, also reports epigastric pain. States has been compliant with medications, states her providers have decreased the dosage of "most all of my medicines EXAM: CT ANGIOGRAPHY CHEST WITH CONTRAST  TECHNIQUE: Multidetector CT imaging of the chest was performed using the standard protocol during bolus administration of intravenous contrast. Multiplanar CT image reconstructions and MIPs were obtained to evaluate the vascular anatomy. CONTRAST:  100 mL of Isovue 370 intravenous contrast COMPARISON:  Current chest radiograph FINDINGS: Cardiovascular: Satisfactory opacification of the pulmonary arteries to the segmental level. No evidence of  pulmonary embolism. Normal heart size. No pericardial effusion. Minimal left coronary artery calcifications. Aorta is normal in caliber. No dissection. Minor atherosclerotic calcifications along the arch. Mediastinum/Nodes: No enlarged mediastinal, hilar, or axillary lymph nodes. Thyroid gland, trachea, and esophagus demonstrate no significant findings. Lungs/Pleura: Lungs are clear. No pleural effusion or pneumothorax. Upper Abdomen: No acute abnormality. Musculoskeletal: Mild disc degenerative changes along the thoracic spine. No fracture or acute finding. No osteoblastic or osteolytic lesions. Review of the MIP images confirms the above findings. IMPRESSION: 1. No evidence of a pulmonary embolism. 2. No acute findings.  Clear lungs. 3. Minor coronary artery calcifications. Minor thoracic aortic atherosclerosis. Aortic Atherosclerosis (ICD10-I70.0). Electronically Signed   By: Lajean Manes M.D.   On: 02/25/2017 14:38    EKG: Independently reviewed. New T wave flattening V2, V3, T wave inversion V4  Assessment/Plan Principal Problem:   Vomiting and diarrhea/ leukocytosis  h/o inguinal hernia with mesh  - ?t diabetic gastroparesis- does not seem to be narcotic withdrawal  - ? c diff as I see she is on Clindamycin- - check C diff PCR  -send for CT abdomen/pelvis - NPO - unable to give antiemetics due to QTc> 500 but will order Zofran in case her QTc improves - Try GI cocktail and Sucralfate - cont PPI - correct electrolytes - IVF - monitor sugars  Active  Problems:  Dehydration - IVF as mentioned above     Chest pain   Abnormal EKG - New T wave flattening V2, V3, T wave inversion V4 - chest pain is likely due to vomitng but EKG is abnormal, and she has risk factors and therefore will f/u Troponin and repeat EKG tomorrow - monitor on telemetry  Hypertensive urgency - ? Due to pain and missing medications - start IV labetalol, Oral Ultram and IV Morphine    DM (diabetes mellitus), type 2 with neurological complications  - she states she has not been above to afford her insulin lately - give Levemir and SSI Q 4 hrs     MDD (major depressive disorder), recurrent episode, severe ([place]) - have put in a request for a psych eval in EPIC   DVT prophylaxis: Lovenox  Code Status: full code  Family Communication: husband at bedside  Disposition Plan: admit to SDU due to uncontrolled BP  Consults called: psych  Admission status: observation    Debbe Odea MD [facility] Pager: [email or web address] Password [email or web address] 7PM-7AM, please contact night-coverage   02/25/2017, 6:08 PM

## 2017-02-25 NOTE — ED Notes (Signed)
Waiting on stepdown bed per bed control and will advise room number when known.

## 2017-02-25 NOTE — [facility] Group Notes (Signed)
[place] Group Notes: (Clinical Social Work)   02/25/2017      Type of Therapy:  Group Therapy   Participation Level:  Did Not Attend - Had been sent to Emergency Department   Selmer Dominion, LCSW 02/25/2017, 12:17 PM

## 2017-02-25 NOTE — ED Notes (Signed)
Kathy at bed placement room assigned [phone]

## 2017-02-25 NOTE — ED Notes (Signed)
Sent add on label to main lab to add mag

## 2017-02-25 NOTE — Progress Notes (Signed)
ANTICOAGULATION CONSULT NOTE - Initial Consult  Pharmacy Consult for heparin Indication: chest pain/ACS  Allergies  Allergen Reactions  . Penicillins Nausea And Vomiting, Rash and Other (See Comments)    Has patient had a PCN reaction causing immediate rash, facial/tongue/throat swelling, SOB or lightheadedness with hypotension: yes Has patient had a PCN reaction causing severe rash involving mucus membranes or skin necrosis: no Has patient had a PCN reaction that required hospitalization: no Has patient had a PCN reaction occurring within the last 10 years: yes If all of the above answers are "NO", then may proceed with Cephalosporin use.    . Tape Rash and Other (See Comments)    needs paper tape     Patient Measurements: Weight: 125 lb 14.1 oz (57.1 kg) Heparin Dosing Weight: 57.1kg  Vital Signs: Temp: 98.7 F (37.1 C) (11/04 2017) Temp Source: Oral (11/04 2017) BP: 162/91 (11/04 2017) Pulse Rate: 90 (11/04 2017)  Labs: Recent Labs    02/24/17 0747 02/25/17 1714  HGB  --  16.9*  HCT  --  48.2*  PLT  --  360  CREATININE 0.76 1.02*    Estimated Creatinine Clearance: 49.1 mL/min (A) (by C-G formula based on SCr of 1.02 mg/dL (H)).   Assessment: Cassie Cuevas with chest pain > transferred from behavioral [facility] hospital.  Not on anticoagulation PTA.   Hgb and plts ok, no bleeding noted.   Goal of Therapy:  Heparin level 0.3-0.7 units/ml Monitor platelets by anticoagulation protocol: Yes   Plan:  Heparin bolus with 3000 units IV x1, then start infusion at 750 units/hr Heparin level in 6 hours Daily heparin level and CBC  Valiant Dills D. Tryton Bodi, PharmD, BCPS Clinical Pharmacist  [phone] 02/25/2017 8:55 PM

## 2017-02-25 NOTE — ED Provider Notes (Signed)
[place] EMERGENCY DEPARTMENT Provider Note   CSN: [id number] Arrival date & time: 02/22/17  1423     History   Chief Complaint Chief Complaint  Patient presents with  . Chest Pain    HPI Cassie Cuevas is a 61 y.o. female.  HPI   Cassie Cuevas is a 61 y.o. female, with a history of depression, DM, hypercholesterolemia, and HTN, presenting to the ED with chest pain. Patient was transferred from [facility] while under care there for MDD/SI.  First states she began to have chest pain after breakfast around 7AM this morning, but then states she has been having chest pain for the last few days.  Pain is waxing and waning, central chest, radiating to left chest, worse with deep breathing and palpation, currently 7/10, described as a sharp pressure. Improved with NTG. Accompanied by nausea and shortness of breath. Accompanied by RN from [facility], [place].  324 mg ASA and one 0.4mg  NTG via EMS prior to arrival.   Denies vomiting/diarrhea, fever/chills, cough, dizziness, peripheral edema/pain, or any other complaints.    Past Medical History:  Diagnosis Date  . Diabetes mellitus without complication ([place])   . Hypercholesterolemia   . Hypertension   . IBS (irritable bowel syndrome)     Patient Active Problem List   Diagnosis Date Noted  . Chest pain 02/25/2017  . MDD (major depressive disorder), recurrent episode, severe ([place]) 02/22/2017    Past Surgical History:  Procedure Laterality Date  . CHOLECYSTECTOMY    . HERNIA REPAIR  X2  . TOTAL ABDOMINAL HYSTERECTOMY      OB History    No data available       Home Medications    Prior to Admission medications   Medication Sig Start Date End Date Taking? Authorizing Provider  ARIPiprazole (ABILIFY) 10 MG tablet Take 10 mg by mouth daily.  07/27/15  Yes [provider]  diazepam (VALIUM) 10 MG tablet Take 10 mg by mouth 2 (two) times daily.  07/28/15  Yes [provider]  esomeprazole (NEXIUM) 40  MG capsule Take 40 mg by mouth daily.  08/13/15  Yes [provider]  fluticasone (FLONASE) 50 MCG/ACT nasal spray Place 2 sprays into both nostrils daily as needed for allergies. For allergies  08/13/15  Yes [provider]  gabapentin (NEURONTIN) 800 MG tablet Take 800-1,600 tablets by mouth 3 (three) times daily. Take one tablet by mouth twice daily and take two tablets at bed time 07/26/15  Yes [provider]  glimepiride (AMARYL) 4 MG tablet Take 4 mg by mouth 2 (two) times daily. 01/26/17  Yes [provider]  HYDROcodone-acetaminophen (NORCO) 7.5-325 MG tablet Take 1 tablet by mouth 2 (two) times daily. 07/28/15  Yes [provider]  losartan (COZAAR) 100 MG tablet Take 100 mg by mouth daily.  07/28/15  Yes [provider]  metFORMIN (GLUCOPHAGE) 500 MG tablet Take 1,000-1,500 tablets by mouth 2 (two) times daily. Take 3 tablets by mouth each morning and 2 tablets by mouth each evening. 06/14/15  Yes [provider]  PARoxetine (PAXIL) 30 MG tablet Take 60 mg by mouth daily. 06/11/12  Yes [provider]  promethazine (PHENERGAN) 25 MG tablet Take 25 mg by mouth every 4 (four) hours as needed. 01/22/17  Yes [provider]  simvastatin (ZOCOR) 40 MG tablet Take 40 mg by mouth daily.  07/27/15  Yes [provider]  traMADol (ULTRAM) 50 MG tablet Take 50 mg by mouth every 6 (  six) hours as needed (For pain.). For fibromyalgia pain 07/28/15  Yes [provider]  VENTOLIN HFA 108 (90 Base) MCG/ACT inhaler Inhale 2 puffs into the lungs every 4 (four) hours as needed for wheezing or shortness of breath. wheezing 08/09/15  Yes [provider]  dicyclomine (BENTYL) 20 MG tablet Take 1 tablet (20 mg total) by mouth 2 (two) times daily. 02/22/17   Harris, Abigail, PA-C  JANUVIA 100 MG tablet Take 100 mg by mouth daily.  07/26/15   [provider]  LEVEMIR FLEXTOUCH 100 UNIT/ML Pen Inject 80 Units into the skin  every evening. 07/26/15   [provider]  meclizine (ANTIVERT) 25 MG tablet Take 1 tablet (25 mg total) by mouth 3 (three) times daily as needed for nausea. 02/22/17   Harris, Abigail, PA-C  NOVOLOG FLEXPEN 100 UNIT/ML FlexPen Inject 20 Units into the skin 2 (two) times daily before a meal. 07/26/15   [provider]    Family History Family History  Problem Relation Age of Onset  . Heart Problems Mother 51       CABG    Social History Social History   Tobacco Use  . Smoking status: Current Every Day Smoker    Packs/day: 1.00    Types: Cigarettes  . Smokeless tobacco: Never Used  Substance Use Topics  . Alcohol use: No  . Drug use: No     Allergies   Penicillins and Tape   Review of Systems Review of Systems  Constitutional: Negative for chills, diaphoresis and fever.  Respiratory: Positive for shortness of breath. Negative for cough.   Cardiovascular: Positive for chest pain. Negative for palpitations and leg swelling.  Gastrointestinal: Positive for nausea. Negative for vomiting.  Neurological: Negative for dizziness and light-headedness.  All other systems reviewed and are negative.    Physical Exam Updated Vital Signs BP (!) 168/99 (BP Location: Right Arm)   Pulse (!) 102   Temp 98.7 F (37.1 C) (Oral)   Resp 18   Ht 5\' 3"  (1.6 m)   Wt 60.8 kg (134 lb)   SpO2 98%   BMI 23.74 kg/m   Physical Exam  Constitutional: She is oriented to person, place, and time. She appears well-developed and well-nourished. No distress.  HENT:  Head: Normocephalic and atraumatic.  Mouth/Throat: Oropharynx is clear and moist.  Eyes: Conjunctivae and EOM are normal. Pupils are equal, round, and reactive to light.  Neck: Neck supple.  Cardiovascular: Regular rhythm, normal heart sounds and intact distal pulses. Tachycardia present.  Pulmonary/Chest: Effort normal and breath sounds normal. No respiratory distress.  Abdominal: Soft. There is no tenderness. There  is no guarding.  Musculoskeletal: She exhibits no edema or tenderness.  Lymphadenopathy:    She has no cervical adenopathy.  Neurological: She is alert and oriented to person, place, and time.  No sensory deficits. Strength 5/5 in all extremities. No gait disturbance. Coordination intact including heel to shin and finger to nose. Cranial nerves III-XII grossly intact. No facial droop.   Skin: Skin is warm and dry. Capillary refill takes less than 2 seconds. She is not diaphoretic.  Psychiatric: She has a normal mood and affect. Her behavior is normal.  Nursing note and vitals reviewed.    ED Treatments / Results  Labs (all labs ordered are listed, but only abnormal results are displayed) Labs Reviewed  GLUCOSE, CAPILLARY - Abnormal; Notable for the following components:      Result Value   Glucose-Capillary 197 (*)  All other components within normal limits  HEMOGLOBIN A1C - Abnormal; Notable for the following components:   Hgb A1c MFr Bld 7.7 (*)    All other components within normal limits  LIPID PANEL - Abnormal; Notable for the following components:   Cholesterol 222 (*)    Triglycerides 815 (*)    HDL 24 (*)    All other components within normal limits  GLUCOSE, CAPILLARY - Abnormal; Notable for the following components:   Glucose-Capillary 213 (*)    All other components within normal limits  GLUCOSE, CAPILLARY - Abnormal; Notable for the following components:   Glucose-Capillary 232 (*)    All other components within normal limits  CBC WITH DIFFERENTIAL/PLATELET - Abnormal; Notable for the following components:   WBC 13.0 (*)    Neutro Abs 9.6 (*)    All other components within normal limits  COMPREHENSIVE METABOLIC PANEL - Abnormal; Notable for the following components:   Glucose, Bld 268 (*)    All other components within normal limits  URINALYSIS, ROUTINE W REFLEX MICROSCOPIC - Abnormal; Notable for the following components:   Specific Gravity, Urine >1.046 (*)      Ketones, ur 5 (*)    Bacteria, UA RARE (*)    Squamous Epithelial / LPF 0-5 (*)    All other components within normal limits  GLUCOSE, CAPILLARY - Abnormal; Notable for the following components:   Glucose-Capillary 200 (*)    All other components within normal limits  GLUCOSE, CAPILLARY - Abnormal; Notable for the following components:   Glucose-Capillary 292 (*)    All other components within normal limits  BASIC METABOLIC PANEL - Abnormal; Notable for the following components:   Sodium 134 (*)    Chloride 97 (*)    Glucose, Bld 180 (*)    All other components within normal limits  MAGNESIUM - Abnormal; Notable for the following components:   Magnesium 1.6 (*)    All other components within normal limits  GLUCOSE, CAPILLARY - Abnormal; Notable for the following components:   Glucose-Capillary 268 (*)    All other components within normal limits  GLUCOSE, CAPILLARY - Abnormal; Notable for the following components:   Glucose-Capillary 247 (*)    All other components within normal limits  GLUCOSE, CAPILLARY - Abnormal; Notable for the following components:   Glucose-Capillary 209 (*)    All other components within normal limits  GLUCOSE, CAPILLARY - Abnormal; Notable for the following components:   Glucose-Capillary 225 (*)    All other components within normal limits  GLUCOSE, CAPILLARY - Abnormal; Notable for the following components:   Glucose-Capillary 219 (*)    All other components within normal limits  GLUCOSE, CAPILLARY - Abnormal; Notable for the following components:   Glucose-Capillary 199 (*)    All other components within normal limits  GLUCOSE, CAPILLARY - Abnormal; Notable for the following components:   Glucose-Capillary 183 (*)    All other components within normal limits  GLUCOSE, CAPILLARY - Abnormal; Notable for the following components:   Glucose-Capillary 251 (*)    All other components within normal limits  GLUCOSE, CAPILLARY - Abnormal; Notable for  the following components:   Glucose-Capillary 183 (*)    All other components within normal limits  I-STAT CHEM 8, ED - Abnormal; Notable for the following components:   Glucose, Bld 266 (*)    Calcium, Ion 1.09 (*)    All other components within normal limits  CBG MONITORING, ED - Abnormal; Notable for the following components:  Glucose-Capillary 225 (*)    All other components within normal limits  CBG MONITORING, ED - Abnormal; Notable for the following components:   Glucose-Capillary 199 (*)    All other components within normal limits  TSH  PROLACTIN  LIPASE, BLOOD  LIPASE, BLOOD  MAGNESIUM  BASIC METABOLIC PANEL  MAGNESIUM  BASIC METABOLIC PANEL  CBC WITH DIFFERENTIAL/PLATELET  HIV ANTIBODY (ROUTINE TESTING)  CBC  CREATININE, SERUM  I-STAT TROPONIN, ED  I-STAT TROPONIN, ED    EKG  EKG Interpretation  Date/Time:  Sunday February 25 2017 10:11:28 EST Ventricular Rate:  110 PR Interval:    QRS Duration: 101 QT Interval:  376 QTC Calculation: 509 R Axis:   159 Text Interpretation:  Sinus tachycardia RAE, consider biatrial enlargement Right ventricular hypertrophy Nonspecific T abnormalities, anterior leads Borderline prolonged QT interval When compared to ECG yesterday, flattened T wave in lead V3 and T wave inversion in lead V4.  No STEMI Confirmed by Antony Blackbird [phone]) on 02/25/2017 11:34:39 AM       Radiology Dg Chest 2 View  Result Date: 02/25/2017 CLINICAL DATA:  61 year old female with chest pain and shortness of breath for 1 day. EXAM: CHEST  2 VIEW COMPARISON:  11/04/2016 FINDINGS: The heart size and mediastinal contours are within normal limits. A new opacity in the right upper lung has suggestive of a developing infiltrate. Previously noted right basilar opacity no longer identified. The left lung is clear. No sizeable effusions or pneumothorax. The visualized skeletal structures are unremarkable. IMPRESSION: 1. Interval development of a right upper lobe  opacity suggestive of developing infiltrate. Continued follow-up recommended. 2. Interval resolution of right basilar opacity. Electronically Signed   By: Kristopher Oppenheim M.D.   On: 02/25/2017 11:58   Ct Angio Chest Pe W And/or Wo Contrast  Result Date: 02/25/2017 CLINICAL DATA:  Pt to ER as transfer from [facility] for evaluation of acute onset of central/left chest pressure onset 2-3 hours ago while "sitting and laying." reports associated shortness of breath and nausea, also reports epigastric pain. States has been compliant with medications, states her providers have decreased the dosage of "most all of my medicines EXAM: CT ANGIOGRAPHY CHEST WITH CONTRAST TECHNIQUE: Multidetector CT imaging of the chest was performed using the standard protocol during bolus administration of intravenous contrast. Multiplanar CT image reconstructions and MIPs were obtained to evaluate the vascular anatomy. CONTRAST:  100 mL of Isovue 370 intravenous contrast COMPARISON:  Current chest radiograph FINDINGS: Cardiovascular: Satisfactory opacification of the pulmonary arteries to the segmental level. No evidence of pulmonary embolism. Normal heart size. No pericardial effusion. Minimal left coronary artery calcifications. Aorta is normal in caliber. No dissection. Minor atherosclerotic calcifications along the arch. Mediastinum/Nodes: No enlarged mediastinal, hilar, or axillary lymph nodes. Thyroid gland, trachea, and esophagus demonstrate no significant findings. Lungs/Pleura: Lungs are clear. No pleural effusion or pneumothorax. Upper Abdomen: No acute abnormality. Musculoskeletal: Mild disc degenerative changes along the thoracic spine. No fracture or acute finding. No osteoblastic or osteolytic lesions. Review of the MIP images confirms the above findings. IMPRESSION: 1. No evidence of a pulmonary embolism. 2. No acute findings.  Clear lungs. 3. Minor coronary artery calcifications. Minor thoracic aortic atherosclerosis. Aortic  Atherosclerosis (ICD10-I70.0). Electronically Signed   By: Lajean Manes M.D.   On: 02/25/2017 14:38    Procedures Procedures (including critical care time)  Medications Ordered in ED Medications  pantoprazole (PROTONIX) EC tablet 40 mg (40 mg Oral Given 02/25/17 0815)  fluticasone (FLONASE) 50 MCG/ACT nasal spray 2  spray (2 sprays Each Nare Given 02/25/17 0813)  linagliptin (TRADJENTA) tablet 5 mg (5 mg Oral Given 02/25/17 0815)  losartan (COZAAR) tablet 100 mg (100 mg Oral Given 02/25/17 0625)  metFORMIN (GLUCOPHAGE) tablet 1,000 mg (1,000 mg Oral Given 02/25/17 0814)  simvastatin (ZOCOR) tablet 40 mg (40 mg Oral Given 02/25/17 0814)  albuterol (PROVENTIL HFA;VENTOLIN HFA) 108 (90 Base) MCG/ACT inhaler 2 puff (not administered)  alum & mag hydroxide-simeth (MAALOX/MYLANTA) 200-200-20 MG/5ML suspension 30 mL (not administered)  magnesium hydroxide (MILK OF MAGNESIA) suspension 30 mL (not administered)  clindamycin (CLEOCIN) capsule 150 mg (150 mg Oral Given 02/25/17 0622)  insulin aspart (novoLOG) injection 0-15 Units (0 Units Subcutaneous Not Given 02/25/17 1331)  insulin aspart (novoLOG) injection 0-5 Units (0 Units Subcutaneous Not Given 02/24/17 2137)  Influenza vac split quadrivalent PF (FLUARIX) injection 0.5 mL (0 mLs Intramuscular Hold 02/24/17 1119)  pneumococcal 23 valent vaccine (PNU-IMMUNE) injection 0.5 mL (0 mLs Intramuscular Hold 02/24/17 1120)  omega-3 acid ethyl esters (LOVAZA) capsule 1 g (1 g Oral Not Given 02/25/17 0816)  mirtazapine (REMERON) tablet 7.5 mg (7.5 mg Oral Given 02/24/17 2136)  diazepam (VALIUM) tablet 5 mg (5 mg Oral Given 02/25/17 0416)  gabapentin (NEURONTIN) capsule 800 mg (800 mg Oral Given 02/25/17 0814)  insulin aspart (novoLOG) injection 3 Units (3 Units Subcutaneous Given 02/25/17 0628)  insulin detemir (LEVEMIR) injection 15 Units (15 Units Subcutaneous Given 02/25/17 0859)  magnesium oxide (MAG-OX) tablet 400 mg (400 mg Oral Given 02/25/17 0815)  aspirin EC  325 MG tablet (not administered)  nitroGLYCERIN (NITROSTAT) SL tablet 0.4 mg (not administered)  morphine 4 MG/ML injection 4 mg (not administered)  sodium chloride 0.9 % bolus 500 mL (not administered)  acetaminophen (TYLENOL) tablet 650 mg (not administered)  enoxaparin (LOVENOX) injection 40 mg (not administered)  sodium chloride 0.9 % bolus 1,000 mL (0 mLs Intravenous Stopped 02/22/17 1754)  fentaNYL (SUBLIMAZE) injection 100 mcg (100 mcg Intravenous Given 02/22/17 1622)  ondansetron (ZOFRAN) injection 4 mg (4 mg Intravenous Given 02/22/17 1622)  iopamidol (ISOVUE-300) 61 % injection 100 mL (100 mLs Intravenous Contrast Given 02/22/17 1641)  iopamidol (ISOVUE-300) 61 % injection (  Contrast Given 02/22/17 1716)  promethazine (PHENERGAN) tablet 12.5 mg ( Oral See Alternative 02/23/17 0437)    Or  promethazine (PHENERGAN) injection 12.5 mg (12.5 mg Intramuscular Given 02/23/17 0437)    Or  promethazine (PHENERGAN) suppository 12.5 mg ( Rectal See Alternative 02/23/17 0437)  diazepam (VALIUM) tablet 5 mg (5 mg Oral Given 02/23/17 1224)  gabapentin (NEURONTIN) capsule 400 mg (400 mg Oral Given 02/23/17 1224)  potassium chloride 20 MEQ/15ML (10%) solution 40 mEq (40 mEq Oral Given 02/23/17 1754)  potassium chloride SA (K-DUR,KLOR-CON) CR tablet 40 mEq (40 mEq Oral Given 02/24/17 1405)  cloNIDine (CATAPRES) tablet 0.1 mg (0.1 mg Oral Given 02/24/17 2204)  nitroGLYCERIN (NITROSTAT) 0.4 MG SL tablet (0.4 mg  Given 02/25/17 0924)  LORazepam (ATIVAN) injection 0.5 mg (0.5 mg Intravenous Given 02/25/17 1428)  iopamidol (ISOVUE-370) 76 % injection (100 mLs Intravenous Contrast Given 02/25/17 1359)     Initial Impression / Assessment and Plan / ED Course  I have reviewed the triage vital signs and the nursing notes.  Pertinent labs & imaging results that were available during my care of the patient were reviewed by me and considered in my medical decision making (see chart for details).  Clinical Course as of  Feb 25 1605  Nancy Fetter Feb 25, 2017  1335 RN states patient is nauseous.  Request  something for nausea.  Patient has not yet received morphine.  Morphine was held via verbal order and order for Ativan was placed.  [SJ]  2025 Patient states her pain has resolved.  [SJ]  89 Imaging and lab results discussed with the patient and patient's husband at the bedside. Patient continues to remain pain-free. Agrees to the plan.  [SJ]  4270 WCBJS with Trish from cardiology. Agrees with admission via hospitalist.   [SJ]  1552 Spoke with Dr. Wynelle Cleveland, hospitalist. Agrees to admit the patient.   [SJ]    Clinical Course User Index [SJ] Lorayne Bender, PA-C     Patient presents from [facility] with complaint of chest pain.  HEART score is 5, indicating moderate risk for a cardiac event. Wells criteria score is 6, indicating moderate risk for PE.  Questionable right upper infiltrate that was noted on x-ray was not noted on CT.  In addition, I do not think patient's presentation clinically correlates with a pneumonia with the symptoms she currently endorses. No evidence of PE on CT.  Patient's symptoms resolved and did not recur.  Admission for chest pain rule out.  Findings and plan of care discussed with Antony Blackbird, MD.    Vitals:   02/24/17 2100 02/25/17 0848 02/25/17 1012 02/25/17 1045  BP: (!) 160/102 (!) 179/103 (!) 168/99 (!) 176/93  Pulse: 99 (!) 111 (!) 102 97  Resp:  16 18 19   Temp:  98.6 F (37 C) 98.7 F (37.1 C)   TempSrc:  Oral Oral   SpO2:   98% 97%  Weight:      Height:          Final Clinical Impressions(s) / ED Diagnoses   Final diagnoses:  Epigastric pain  Nausea  Non-intractable vomiting with nausea, unspecified vomiting type  Chest pain, unspecified type  Prolonged QT interval    New Prescriptions This SmartLink is deprecated. Use AVSMEDLIST instead to display the medication list for a patient.   Lorayne Bender, PA-C 02/25/17 1555    Lorayne Bender, PA-C 02/25/17 1607      Tegeler, Gwenyth Allegra, MD 02/25/17 [place]ly [place]

## 2017-02-26 ENCOUNTER — Observation Stay (HOSPITAL_BASED_OUTPATIENT_CLINIC_OR_DEPARTMENT_OTHER): Payer: Medicare PPO

## 2017-02-26 ENCOUNTER — Encounter (HOSPITAL_COMMUNITY): Payer: Self-pay | Admitting: *Deleted

## 2017-02-26 DIAGNOSIS — R9431 Abnormal electrocardiogram [ECG] [EKG]: Secondary | ICD-10-CM

## 2017-02-26 DIAGNOSIS — R072 Precordial pain: Secondary | ICD-10-CM | POA: Diagnosis not present

## 2017-02-26 DIAGNOSIS — G629 Polyneuropathy, unspecified: Secondary | ICD-10-CM

## 2017-02-26 DIAGNOSIS — E860 Dehydration: Secondary | ICD-10-CM

## 2017-02-26 DIAGNOSIS — Z813 Family history of other psychoactive substance abuse and dependence: Secondary | ICD-10-CM

## 2017-02-26 DIAGNOSIS — R0789 Other chest pain: Secondary | ICD-10-CM | POA: Diagnosis not present

## 2017-02-26 DIAGNOSIS — R110 Nausea: Secondary | ICD-10-CM

## 2017-02-26 DIAGNOSIS — R1110 Vomiting, unspecified: Secondary | ICD-10-CM | POA: Diagnosis not present

## 2017-02-26 DIAGNOSIS — F17210 Nicotine dependence, cigarettes, uncomplicated: Secondary | ICD-10-CM

## 2017-02-26 DIAGNOSIS — Z818 Family history of other mental and behavioral disorders: Secondary | ICD-10-CM

## 2017-02-26 LAB — TROPONIN I

## 2017-02-26 LAB — BASIC METABOLIC PANEL
ANION GAP: 15 (ref 5–15)
BUN: 31 mg/dL — ABNORMAL HIGH (ref 6–20)
CHLORIDE: 99 mmol/L — AB (ref 101–111)
CO2: 18 mmol/L — ABNORMAL LOW (ref 22–32)
Calcium: 9.4 mg/dL (ref 8.9–10.3)
Creatinine, Ser: 0.85 mg/dL (ref 0.44–1.00)
GFR calc Af Amer: >60 mL/min (ref >60)
Glucose, Bld: 252 mg/dL — ABNORMAL HIGH (ref 65–99)
POTASSIUM: 3.9 mmol/L (ref 3.5–5.1)
Sodium: 132 mmol/L — ABNORMAL LOW (ref 135–145)

## 2017-02-26 LAB — HEPARIN LEVEL (UNFRACTIONATED): Heparin Unfractionated: <0.1 IU/mL — ABNORMAL LOW (ref 0.30–0.70)

## 2017-02-26 LAB — GLUCOSE, CAPILLARY
GLUCOSE-CAPILLARY: 213 mg/dL — AB (ref 65–99)
GLUCOSE-CAPILLARY: 229 mg/dL — AB (ref 65–99)
Glucose-Capillary: 297 mg/dL — ABNORMAL HIGH (ref 65–99)

## 2017-02-26 LAB — ECHOCARDIOGRAM COMPLETE
Height: 63 in
Weight: 2014.12 oz

## 2017-02-26 LAB — CBC
HEMATOCRIT: 45.7 % (ref 36.0–46.0)
HEMOGLOBIN: 16.2 g/dL — AB (ref 12.0–15.0)
MCH: 33 pg (ref 26.0–34.0)
MCHC: 35.4 g/dL (ref 30.0–36.0)
MCV: 93.1 fL (ref 78.0–100.0)
Platelets: 319 10*3/uL (ref 150–400)
RBC: 4.91 MIL/uL (ref 3.87–5.11)
RDW: 13.6 % (ref 11.5–15.5)
WBC: 17.4 10*3/uL — ABNORMAL HIGH (ref 4.0–10.5)

## 2017-02-26 LAB — MRSA PCR SCREENING: MRSA by PCR: NEGATIVE

## 2017-02-26 LAB — HIV ANTIBODY (ROUTINE TESTING W REFLEX): HIV Screen 4th Generation wRfx: NONREACTIVE

## 2017-02-26 MED ORDER — LABETALOL HCL 100 MG PO TABS
100.0000 mg | ORAL_TABLET | Freq: Two times a day (BID) | ORAL | Status: DC
  Administered 2017-02-26: 100 mg via ORAL
  Filled 2017-02-26: qty 1

## 2017-02-26 MED ORDER — ACETAMINOPHEN 325 MG PO TABS: 650.0000 mg | ORAL_TABLET | Freq: Four times a day (QID) | ORAL | Status: DC | PRN

## 2017-02-26 MED ORDER — INSULIN DETEMIR 100 UNIT/ML ~~LOC~~ SOLN: 15.0000 [IU] | Freq: Every day | SUBCUTANEOUS | Status: DC

## 2017-02-26 MED ORDER — SODIUM CHLORIDE 0.9 % IV SOLN
INTRAVENOUS | Status: DC
  Administered 2017-02-26: 09:00:00 via INTRAVENOUS

## 2017-02-26 MED ORDER — INSULIN ASPART 100 UNIT/ML ~~LOC~~ SOLN: 0.0000 [IU] | Freq: Every day | SUBCUTANEOUS | Status: DC

## 2017-02-26 MED ORDER — GABAPENTIN 800 MG PO TABS
800.0000 mg | ORAL_TABLET | Freq: Two times a day (BID) | ORAL | Status: DC
  Filled 2017-02-26: qty 1

## 2017-02-26 MED ORDER — DIAZEPAM 5 MG PO TABS
10.0000 mg | ORAL_TABLET | Freq: Two times a day (BID) | ORAL | Status: DC
  Administered 2017-02-26: 10 mg via ORAL
  Filled 2017-02-26: qty 2

## 2017-02-26 MED ORDER — GABAPENTIN 800 MG PO TABS: 1600.0000 mg | ORAL_TABLET | Freq: Every day | ORAL | Status: DC

## 2017-02-26 MED ORDER — INSULIN ASPART 100 UNIT/ML ~~LOC~~ SOLN
0.0000 [IU] | Freq: Three times a day (TID) | SUBCUTANEOUS | Status: DC
  Administered 2017-02-26 (×2): 5 [IU] via SUBCUTANEOUS
  Administered 2017-02-26: 8 [IU] via SUBCUTANEOUS

## 2017-02-26 MED ORDER — LOSARTAN POTASSIUM 50 MG PO TABS
100.0000 mg | ORAL_TABLET | Freq: Every day | ORAL | Status: DC
  Administered 2017-02-26: 100 mg via ORAL
  Filled 2017-02-26: qty 2

## 2017-02-26 MED ORDER — GABAPENTIN 400 MG PO CAPS: 1600.0000 mg | ORAL_CAPSULE | Freq: Every day | ORAL | Status: DC

## 2017-02-26 MED ORDER — GABAPENTIN 400 MG PO CAPS
800.0000 mg | ORAL_CAPSULE | ORAL | Status: DC
  Administered 2017-02-26 (×2): 800 mg via ORAL
  Filled 2017-02-26 (×2): qty 2

## 2017-02-26 MED ORDER — HYDRALAZINE HCL 20 MG/ML IJ SOLN
5.0000 mg | INTRAMUSCULAR | Status: AC | PRN
  Administered 2017-02-26 (×2): 5 mg via INTRAVENOUS
  Filled 2017-02-26 (×2): qty 1

## 2017-02-26 MED ORDER — HEPARIN BOLUS VIA INFUSION
1500.0000 [IU] | Freq: Once | INTRAVENOUS | Status: AC
  Administered 2017-02-26: 1500 [IU] via INTRAVENOUS
  Filled 2017-02-26: qty 1500

## 2017-02-26 MED ORDER — LABETALOL HCL 100 MG PO TABS: 100.0000 mg | ORAL_TABLET | Freq: Two times a day (BID) | ORAL | 0 refills | Status: DC

## 2017-02-26 MED ORDER — PAROXETINE HCL 30 MG PO TABS
60.0000 mg | ORAL_TABLET | Freq: Every day | ORAL | Status: DC
  Administered 2017-02-26: 60 mg via ORAL
  Filled 2017-02-26: qty 2

## 2017-02-26 MED ORDER — INSULIN DETEMIR 100 UNIT/ML FLEXPEN
15.0000 [IU] | PEN_INJECTOR | Freq: Every day | SUBCUTANEOUS | Status: DC
  Administered 2017-02-26: 15 [IU] via SUBCUTANEOUS

## 2017-02-26 NOTE — Care Management Note (Addendum)
Case Management Note  Patient Details  Name: Asencion Guisinger MRN: [id number] Date of Birth: Jan 02, 1956  Subjective/Objective:    Pt admitted from [facility] for treatment of depression, vomiting and diarrhea                Action/Plan:  Prior to [facility] pt states she was from home with husband.  CM was informed by bedside nurse that pt states she is having difficulty paying for copays.  CM spoke with pt - pt states that the copay for all of her insulin medications are expensive - however pt states she can pay for them if needed.  CM explained to pt that medication assistance can not be given due to active insurance coverage, CM did get bedside nurse to discuss less expensive insulin options with attending prior to discharge.  Psych cleared pt to discharge home.  No CM needs determined prior to discharge   Expected Discharge Date:  02/26/17               Expected Discharge Plan:  Home/Self Care  In-House Referral:     Discharge planning Services  CM Consult  Post Acute Care Choice:    Choice offered to:     DME Arranged:    DME Agency:     HH Arranged:    [place] Agency:     Status of Service:  Completed, signed off  If discussed at [employer] of Stay Meetings, dates discussed:    Additional Comments:  Maryclare Labrador, RN 02/26/2017, 5:38 PM

## 2017-02-26 NOTE — Plan of Care (Signed)
Will continue to improve and decrease suicidal ideation

## 2017-02-26 NOTE — Progress Notes (Signed)
Pt received belongings for [facility] by security, purse,shoes, phone. Pt being discharge via w/c to [employer] by taxi voucher to home.

## 2017-02-26 NOTE — Discharge Instructions (Signed)
Blood Glucose Monitoring, Adult Monitoring your blood sugar (glucose) helps you manage your diabetes. It also helps you and your health care provider determine how well your diabetes management plan is working. Blood glucose monitoring involves checking your blood glucose as often as directed, and keeping a record (log) of your results over time. Why should I monitor my blood glucose? Checking your blood glucose regularly can:  Help you understand how food, exercise, illnesses, and medicines affect your blood glucose.  Let you know what your blood glucose is at any time. You can quickly tell if you are having low blood glucose (hypoglycemia) or high blood glucose (hyperglycemia).  Help you and your health care provider adjust your medicines as needed.  When should I check my blood glucose? Follow instructions from your health care provider about how often to check your blood glucose. This may depend on:  The type of diabetes you have.  How well-controlled your diabetes is.  Medicines you are taking.  If you have type 1 diabetes:  Check your blood glucose at least 2 times a day.  Also check your blood glucose: ? Before every insulin injection. ? Before and after exercise. ? Between meals. ? 2 hours after a meal. ? Occasionally between 2:00 a.m. and 3:00 a.m., as directed. ? Before potentially dangerous tasks, like driving or using heavy machinery. ? At bedtime.  You may need to check your blood glucose more often, up to 6-10 times a day: ? If you use an insulin pump. ? If you need multiple daily injections (MDI). ? If your diabetes is not well-controlled. ? If you are ill. ? If you have a history of severe hypoglycemia. ? If you have a history of not knowing when your blood glucose is getting low (hypoglycemia unawareness). If you have type 2 diabetes:  If you take insulin or other diabetes medicines, check your blood glucose at least 2 times a day.  If you are on  intensive insulin therapy, check your blood glucose at least 4 times a day. Occasionally, you may also need to check between 2:00 a.m. and 3:00 a.m., as directed.  Also check your blood glucose: ? Before and after exercise. ? Before potentially dangerous tasks, like driving or using heavy machinery.  You may need to check your blood glucose more often if: ? Your medicine is being adjusted. ? Your diabetes is not well-controlled. ? You are ill. What is a blood glucose log?  A blood glucose log is a record of your blood glucose readings. It helps you and your health care provider: ? Look for patterns in your blood glucose over time. ? Adjust your diabetes management plan as needed.  Every time you check your blood glucose, write down your result and notes about things that may be affecting your blood glucose, such as your diet and exercise for the day.  Most glucose meters store a record of glucose readings in the meter. Some meters allow you to download your records to a computer. How do I check my blood glucose? Follow these steps to get accurate readings of your blood glucose: Supplies needed   Blood glucose meter.  Test strips for your meter. Each meter has its own strips. You must use the strips that come with your meter.  A needle to prick your finger (lancet). Do not use lancets more than once.  A device that holds the lancet (lancing device).  A journal or log book to write down your results. Procedure  Wash your hands with soap and water.  Prick the side of your finger (not the tip) with the lancet. Use a different finger each time.  Gently rub the finger until a small drop of blood appears.  Follow instructions that come with your meter for inserting the test strip, applying blood to the strip, and using your blood glucose meter.  Write down your result and any notes. Alternative testing sites  Some meters allow you to use areas of your body other than your  finger (alternative sites) to test your blood.  If you think you may have hypoglycemia, or if you have hypoglycemia unawareness, do not use alternative sites. Use your finger instead.  Alternative sites may not be as accurate as the fingers, because blood flow is slower in these areas. This means that the result you get may be delayed, and it may be different from the result that you would get from your finger.  The most common alternative sites are: ? Forearm. ? Thigh. ? Palm of the hand. Additional tips  Always keep your supplies with you.  If you have questions or need help, all blood glucose meters have a 24-hour hotline number that you can call. You may also contact your health care provider.  After you use a few boxes of test strips, adjust (calibrate) your blood glucose meter by following instructions that came with your meter. This information is not intended to replace advice given to you by your health care provider. Make sure you discuss any questions you have with your health care provider. Document Released: 04/13/2003 Document Revised: 10/29/2015 Document Reviewed: 09/20/2015 Elsevier Interactive Patient Education  2017 [place].    Please take all your medications with you for your next visit with your Primary MD. Please request your Primary MD to go over all hospital test results at the follow up. Please ask your Primary MD to get all Hospital records sent to his/her office.  If you experience worsening of your admission symptoms, develop shortness of breath, chest pain, suicidal or homicidal thoughts or a life threatening emergency, you must seek medical attention immediately by calling 911 or calling your MD.  Dennis Bast must read the complete instructions/literature along with all the possible adverse reactions/side effects for all the medicines you take including new medications that have been prescribed to you. Take new medicines after you have completely understood  and accpet all the possible adverse reactions/side effects.   Do not drive when taking pain medications or sedatives.    Do not take more than prescribed Pain, Sleep and Anxiety Medications  If you have smoked or chewed Tobacco in the last 2 yrs please stop. Stop any regular alcohol and or recreational drug use.  Wear Seat belts while driving.

## 2017-02-26 NOTE — Consult Note (Signed)
[facility] Consult   Reason for Consult:  Depression and anxiety Referring Physician:  Dr. Wynelle Cleveland Patient Identification: Cassie Cuevas MRN:  [id number] Principal Diagnosis: Vomiting and diarrhea Diagnosis:   Patient Active Problem List   Diagnosis Date Noted  . Chest pain [R07.9] 02/25/2017  . Abnormal EKG [R94.31] 02/25/2017  . Vomiting and diarrhea [R11.10, R19.7] 02/25/2017  . DM (diabetes mellitus), type 2 with neurological complications ([place]) [U38.45] 02/25/2017  . MDD (major depressive disorder), recurrent episode, severe ([place]) [F33.2] 02/22/2017    Total Time spent with patient: 1 hour  Subjective:   Cassie Cuevas is a 61 y.o. female patient admitted with depression.  HPI:  Cassie Cuevas is a 61 y.o. female with medical history of DM 2 uncontrolled unable to take medications, nausea/ vomiting and abdominal pain (? Gastroparesis), HTN and depression.  Patient was initially admitted herself to the behavioral health hospital for increased symptoms of depression and anxiety secondary to loss of her baby sister secondary to intentional drug overdose.  Patient reported she was sent to the emergency department secondary to increasing symptoms of nausea vomiting and unable to take oral medications, food and drinks.  Patient reported since she was admitted to the medical floor she has been getting better with the therapy she is receiving.  Patient endorses mild symptoms of depression, anxiety but no current suicidal/homicidal ideation, intention or plans.  Patient reported she would like to participate in outpatient medication management and want to go back to home live with her husband and take care of her father-in-law.  Patient repeatedly denied safety concerns, suicidal and homicidal ideation during my evaluation.  Patient reported she was positively responded to previous medication Paxil and not done well with her recent medication Abilify trail while in the behavioral  health Hospital..  Patient currently taking Gabapentin 800 mg 2 times daily and 600 mg at bedtime, Paxil 60 mg daily for depression.  Patient appeared with improved depression and anxiety and less nausea and vomiting hoping to be discharged home soon.  Past Psychiatric History: Depression, and denied previous acute psychiatric hospitalization but reported has been managing with out patient medication management with PCP until three weeks ago. She  has been depressed since he was age 68 years ol.  Risk to Self: Is patient at risk for suicide?: NO Risk to Others:   Prior Inpatient Therapy:   Prior Outpatient Therapy:    Past Medical History:  Past Medical History:  Diagnosis Date  . Diabetes mellitus without complication ([place])   . Hypercholesterolemia   . Hypertension   . IBS (irritable bowel syndrome)     Past Surgical History:  Procedure Laterality Date  . CHOLECYSTECTOMY    . HERNIA REPAIR  X2  . TOTAL ABDOMINAL HYSTERECTOMY     Family History:  Family History  Problem Relation Age of Onset  . Heart Problems Mother 21       CABG   Family Psychiatric  History: Significant for substance abuse in her sister who was recently deceased with overdose about 3 weeks ago. Social History:  Social History   Substance and Sexual Activity  Alcohol Use No     Social History   Substance and Sexual Activity  Drug Use No    Social History   Socioeconomic History  . Marital status: Legally Separated    Spouse name: None  . Number of children: None  . Years of education: None  . Highest education level: None  Social Needs  . Emergency planning/management officer  strain: None  . Food insecurity - worry: None  . Food insecurity - inability: None  . Transportation needs - medical: None  . Transportation needs - non-medical: None  Occupational History  . None  Tobacco Use  . Smoking status: Current Every Day Smoker    Packs/day: 1.00    Types: Cigarettes  . Smokeless tobacco: Never Used   Substance and Sexual Activity  . Alcohol use: No  . Drug use: No  . Sexual activity: None  Other Topics Concern  . None  Social History Narrative  . None   Additional Social History:    Allergies:   Allergies  Allergen Reactions  . Penicillins Nausea And Vomiting, Rash and Other (See Comments)    Has patient had a PCN reaction causing immediate rash, facial/tongue/throat swelling, SOB or lightheadedness with hypotension: yes Has patient had a PCN reaction causing severe rash involving mucus membranes or skin necrosis: no Has patient had a PCN reaction that required hospitalization: no Has patient had a PCN reaction occurring within the last 10 years: yes If all of the above answers are "NO", then may proceed with Cephalosporin use.    . Tape Rash and Other (See Comments)    needs paper tape     Labs:  Results for orders placed or performed during the hospital encounter of 02/25/17 (from the past 48 hour(s))  CBC     Status: Abnormal   Collection Time: 02/25/17  9:12 PM  Result Value Ref Range   WBC 19.6 (H) 4.0 - 10.5 K/uL   RBC 4.94 3.87 - 5.11 MIL/uL   Hemoglobin 16.2 (H) 12.0 - 15.0 g/dL   HCT 46.4 (H) 36.0 - 46.0 %   MCV 93.9 78.0 - 100.0 fL   MCH 32.8 26.0 - 34.0 pg   MCHC 34.9 30.0 - 36.0 g/dL   RDW 13.4 11.5 - 15.5 %   Platelets 319 150 - 400 K/uL  Creatinine, serum     Status: None   Collection Time: 02/25/17  9:12 PM  Result Value Ref Range   Creatinine, Ser 0.94 0.44 - 1.00 mg/dL   GFR calc non Af Amer >60 >60 mL/min   GFR calc Af Amer >60 >60 mL/min    Comment: (NOTE) The eGFR has been calculated using the CKD EPI equation. This calculation has not been validated in all clinical situations. eGFR's persistently <60 mL/min signify possible Chronic Kidney Disease.   Troponin I     Status: None   Collection Time: 02/25/17  9:12 PM  Result Value Ref Range   Troponin I <0.03 <0.03 ng/mL  Hemoglobin A1c     Status: Abnormal   Collection Time: 02/25/17   9:12 PM  Result Value Ref Range   Hgb A1c MFr Bld 7.9 (H) 4.8 - 5.6 %    Comment: (NOTE) Pre diabetes:          5.7%-6.4% Diabetes:              >6.4% Glycemic control for   <7.0% adults with diabetes    Mean Plasma Glucose 180.03 mg/dL  Lipase, blood     Status: None   Collection Time: 02/25/17  9:12 PM  Result Value Ref Range   Lipase 30 11 - 51 U/L  Troponin I     Status: None   Collection Time: 02/26/17  2:51 AM  Result Value Ref Range   Troponin I <0.03 <0.03 ng/mL  Heparin level (unfractionated)     Status: Abnormal  Collection Time: 02/26/17  2:51 AM  Result Value Ref Range   Heparin Unfractionated <0.10 (L) 0.30 - 0.70 IU/mL    Comment:        IF HEPARIN RESULTS ARE BELOW EXPECTED VALUES, AND PATIENT DOSAGE HAS BEEN CONFIRMED, SUGGEST FOLLOW UP TESTING OF ANTITHROMBIN III LEVELS.   Glucose, capillary     Status: Abnormal   Collection Time: 02/26/17  8:38 AM  Result Value Ref Range   Glucose-Capillary 297 (H) 65 - 99 mg/dL   Comment 1 Notify RN    Comment 2 Document in Chart   CBC     Status: Abnormal   Collection Time: 02/26/17  9:10 AM  Result Value Ref Range   WBC 17.4 (H) 4.0 - 10.5 K/uL   RBC 4.91 3.87 - 5.11 MIL/uL   Hemoglobin 16.2 (H) 12.0 - 15.0 g/dL   HCT 45.7 36.0 - 46.0 %   MCV 93.1 78.0 - 100.0 fL   MCH 33.0 26.0 - 34.0 pg   MCHC 35.4 30.0 - 36.0 g/dL   RDW 13.6 11.5 - 15.5 %   Platelets 319 150 - 400 K/uL  Heparin level (unfractionated)     Status: Abnormal   Collection Time: 02/26/17  9:10 AM  Result Value Ref Range   Heparin Unfractionated <0.10 (L) 0.30 - 0.70 IU/mL    Comment:        IF HEPARIN RESULTS ARE BELOW EXPECTED VALUES, AND PATIENT DOSAGE HAS BEEN CONFIRMED, SUGGEST FOLLOW UP TESTING OF ANTITHROMBIN III LEVELS.     Current Facility-Administered Medications  Medication Dose Route Frequency Provider Last Rate Last Dose  . 0.9 %  sodium chloride infusion  250 mL Intravenous PRN Rizwan, Eunice Blase, MD      . 0.9 %  sodium  chloride infusion   Intravenous Continuous Debbe Odea, MD 75 mL/hr at 02/26/17 0849    . diazepam (VALIUM) tablet 10 mg  10 mg Oral BID Debbe Odea, MD      . gabapentin (NEURONTIN) tablet 800 mg  800 mg Oral TID Debbe Odea, MD      . insulin aspart (novoLOG) injection 0-15 Units  0-15 Units Subcutaneous TID WC Debbe Odea, MD   8 Units at 02/26/17 0844  . insulin aspart (novoLOG) injection 0-5 Units  0-5 Units Subcutaneous QHS Debbe Odea, MD      . Insulin Detemir (LEVEMIR) FlexPen 15 Units  15 Units Subcutaneous Daily Debbe Odea, MD   15 Units at 02/26/17 0847  . labetalol (NORMODYNE) tablet 100 mg  100 mg Oral BID Debbe Odea, MD   100 mg at 02/26/17 1034  . losartan (COZAAR) tablet 100 mg  100 mg Oral Daily Blount, Xenia T, NP   100 mg at 02/26/17 1034  . ondansetron (ZOFRAN) injection 4 mg  4 mg Intravenous Q6H PRN Debbe Odea, MD   4 mg at 02/25/17 2111  . ondansetron (ZOFRAN) tablet 4 mg  4 mg Oral Q6H PRN Debbe Odea, MD      . pantoprazole (PROTONIX) EC tablet 40 mg  40 mg Oral Daily Debbe Odea, MD   40 mg at 02/26/17 0801  . PARoxetine (PAXIL) tablet 60 mg  60 mg Oral Daily Rizwan, Saima, MD      . sodium chloride flush (NS) 0.9 % injection 3 mL  3 mL Intravenous Q12H Debbe Odea, MD   3 mL at 02/26/17 0806  . sodium chloride flush (NS) 0.9 % injection 3 mL  3 mL Intravenous PRN Debbe Odea, MD      .  traMADol (ULTRAM) tablet 50 mg  50 mg Oral Q6H Debbe Odea, MD   50 mg at 02/26/17 5790    Musculoskeletal: Strength & Muscle Tone: decreased Gait & Station: unable to stand Patient leans: N/A  Psychiatric Specialty Exam: Physical Exam history and physical  ROS complaining about generalized weakness, tired being in the hospital not able to have any physical activity.  Patient denies current nausea and vomiting and able to tolerate her fluids and sleeping okay. No Fever-chills, No Headache, No changes with Vision or hearing, reports vertigo No problems  swallowing food or Liquids, No Chest pain, Cough or Shortness of Breath, No Abdominal pain, No Nausea or Vommitting, Bowel movements are regular, No Blood in stool or Urine, No dysuria, No new skin rashes or bruises, No new joints pains-aches,  No new weakness, tingling, numbness in any extremity, No recent weight gain or loss, No polyuria, polydypsia or polyphagia,  A full 10 point Review of Systems was done, except as stated above, all other Review of Systems were negative.  Blood pressure (!) 147/93, pulse (!) 108, temperature 97.9 F (36.6 C), temperature source Axillary, resp. rate 20, height '5\' 3"'  (1.6 m), weight 57.1 kg (125 lb 14.1 oz), SpO2 98 %.Body mass index is 22.3 kg/m.  General Appearance: Guarded  Eye Contact:  Good  Speech:  Clear and Coherent and Slow  Volume:  Decreased  Mood:  Anxious and Depressed  Affect:  Constricted and Depressed  Thought Process:  Coherent and Goal Directed  Orientation:  Full (Time, Place, and Person)  Thought Content:  Logical and Rumination  Suicidal Thoughts:  No  Homicidal Thoughts:  No  Memory:  Immediate;   Good Recent;   Fair Remote;   Fair  Judgement:  Intact  Insight:  Good  Psychomotor Activity:  Decreased  Concentration:  Concentration: Fair and Attention Span: Fair  Recall:  Good  Fund of Knowledge:  Good  Language:  Good  Akathisia:  Negative  Handed:  Right  AIMS (if indicated):     Assets:  Communication Skills Desire for Improvement Financial Resources/Insurance Housing Intimacy Leisure Time Resilience Social Support Talents/Skills Transportation  ADL's:  Impaired  Cognition:  WNL  Sleep:        Treatment Plan Summary: 61 years old female with a history of major depressive disorder which is chronic since age 5 years old presented with increased symptoms of depression and anxiety after the death of her 59 years old sister secondary to intentional overdose of polysubstance abuse, mostly accidental.   Patient committed herself to the behavioral health Hospital and then required transfer to the medical hospitalization for increased symptoms of nausea and vomiting secondary to gastroparesis.   Major depressive disorder, recurrent, severe without psychotic symptoms Grief reaction.  Recommendation: Discontinue safety stress patient contract for safety at this time Continue Paxil 60 mg daily depression,  Continue gabapentin 800 mg twice daily and 600 mg at bedtime for neuropathic pain Appreciate psychiatric consultation and we sign off as of today Please contact [phone] or [phone] if needs further assistance   Disposition: Patient does not meet criteria for psychiatric inpatient admission. Supportive therapy provided about ongoing stressors.  Ambrose Finland, MD 02/26/2017 10:49 AM

## 2017-02-26 NOTE — Progress Notes (Signed)
ANTICOAGULATION CONSULT NOTE - F/u Consult  Pharmacy Consult for heparin Indication: chest pain/ACS  Allergies  Allergen Reactions  . Penicillins Nausea And Vomiting, Rash and Other (See Comments)    Has patient had a PCN reaction causing immediate rash, facial/tongue/throat swelling, SOB or lightheadedness with hypotension: yes Has patient had a PCN reaction causing severe rash involving mucus membranes or skin necrosis: no Has patient had a PCN reaction that required hospitalization: no Has patient had a PCN reaction occurring within the last 10 years: yes If all of the above answers are "NO", then may proceed with Cephalosporin use.    . Tape Rash and Other (See Comments)    needs paper tape     Patient Measurements: Height: 5\' 3"  (160 cm) Weight: 125 lb 14.1 oz (57.1 kg) IBW/kg (Calculated) : 52.4 Heparin Dosing Weight: 57.1kg  Vital Signs: Temp: 99 F (37.2 C) (11/05 0300) Temp Source: Oral (11/05 0300) BP: 177/100 (11/05 0037) Pulse Rate: 106 (11/05 0000)  Labs: Recent Labs    02/24/17 0747 02/25/17 1714 02/25/17 2112 02/26/17 0251  HGB  --  16.9* 16.2*  --   HCT  --  48.2* 46.4*  --   PLT  --  360 319  --   HEPARINUNFRC  --   --   --  <0.10*  CREATININE 0.76 1.02* 0.94  --   TROPONINI  --   --  <0.03  --     Estimated Creatinine Clearance: 53.3 mL/min (by C-G formula based on SCr of 0.94 mg/dL).  Assessment: 42 YOF with chest pain > transferred from behavioral health hospital.  Not on anticoagulation PTA. Pharmacy consulted to dose heparin for ACS.   Heparin level undetectable and no infusion issues per RN. No new CBC, however, no bleeding per RN.   Goal of Therapy:  Heparin level 0.3-0.7 units/ml Monitor platelets by anticoagulation protocol: Yes   Plan:  Heparin bolus 1500 units IV x1 Increase heparin gtt to 900 units/hr Heparin level in 6 hrs Daily heparin level and CBC Monitor for s/s bleeding   Lavonda Jumbo, PharmD Clinical  Pharmacist 02/26/17 4:18 AM

## 2017-02-26 NOTE — Progress Notes (Signed)
Pt stated to dr Wynelle Cleveland that she can afford her medication when she mentionned to [place] earlier could not afford. Sitter chelsea NT at bedside when talked to dr. Wynelle Cleveland.

## 2017-02-26 NOTE — Discharge Summary (Signed)
Physician Discharge Summary  Cassie Cuevas IRW:[id number] DOB: [birth year] DOA: 02/25/2017  PCP: Cassie Cuevas., MD  Admit date: 02/25/2017 Discharge date: 02/26/2017  Admitted From: home  Disposition:  home   Recommendations for Outpatient Follow-up:  1. bmet in 1 wk 2. F/u on BP 3. Consider weaning off of Valium  Discharge Condition:  stable   CODE STATUS:  Full code   Diet recommendation:  Heart healthy, diabetic Consultations:  psych    Discharge Diagnoses:  Principal Problem:   Vomiting and diarrhea Active Problems:   Chest pain   MDD (major depressive disorder), recurrent episode, severe (HCC)   Abnormal EKG   DM (diabetes mellitus), type 2 with neurological complications (HCC)   Dehydration    Subjective: Nausea, vomiting resolved- last episode of vomiting was in ER yesterday. No diarrhea in the hospital. Abdomen is a little sore. No other complaints. Would like to go home rather than return to behavioral health.   Brief Summary: Cassie Cuevas is a 61 y.o. female with medical history of DM 2 uncontrolled unable to take medications, nausea/ vomiting and abdominal pain (? Gastroparesis), HTN and depression who was admitted to the [facility] for treatment of depression. She states she has been vomiting and having diarrhea for the past 4 days while there. She has not eaten anything today and has not urinated today. She states that her symptoms are typically not this severe at home. She takes Ultram occasionally for pain and not routinely. She has Hydrocodone mentioned on her home med list as well which she states she has not taken in over a month. Her husband confirms this.     Hospital Course:  Principal Problem:   Vomiting and diarrhea/ leukocytosis  h/o inguinal hernia with mesh  - ? diabetic gastroparesis- did not seem to be narcotic withdrawal   -  CT abdomen/pelvis which was unremarkable other than moderate stool burden   - as diarrhea resolved quickly, no  stool studies done  - today she has tolerated solid food without issues and is wanting to be discharged home  Active Problems:  Dehydration -  Has improved with IV hydration- she states she has voided multiple times today     Chest pain   Abnormal EKG - New T wave flattening V2, V3, T wave inversion V4 - chest pain is likely due to vomitng but EKG was abnormal, and she has risk factors, ordered Troponin   - Troponin found to be negative x 3 sets - EKG changes have resolved today - no abnormalities noted on telemetry  Hypertensive urgency - ? Due to pain and missing medications - started IV labetalol, Oral Ultram and IV Morphine which has helped - now able to tolerate oral medications     DM (diabetes mellitus), type 2 with neurological complications  - she states she has not been able to afford her insulin lately - given Levemir and SSI Q 4 hrs - have just spoken with her about 2 minutes ago and she states she will be able to afford her insulin now - HbA1c 7.9     MDD (major depressive disorder), recurrent episode, severe ([place]) - have requested psych eval - recommendations are to continue Paxil and Neurontin    Discharge Instructions  Discharge Instructions    Diet - low sodium heart healthy   Complete by:  As directed    Diet Carb Modified   Complete by:  As directed    Increase activity slowly   Complete by:  As directed      Allergies as of 02/26/2017      Reactions   Penicillins Nausea And Vomiting, Rash, Other (See Comments)   Has patient had a PCN reaction causing immediate rash, facial/tongue/throat swelling, SOB or lightheadedness with hypotension: yes Has patient had a PCN reaction causing severe rash involving mucus membranes or skin necrosis: no Has patient had a PCN reaction that required hospitalization: no Has patient had a PCN reaction occurring within the last 10 years: yes If all of the above answers are "NO", then may proceed with  Cephalosporin use.   Tape Rash, Other (See Comments)   needs paper tape      Medication List    STOP taking these medications   ARIPiprazole 10 MG tablet Commonly known as:  ABILIFY   HYDROcodone-acetaminophen 7.5-325 MG tablet Commonly known as:  NORCO   meclizine 25 MG tablet Commonly known as:  ANTIVERT     TAKE these medications   diazepam 10 MG tablet Commonly known as:  VALIUM Take 10 mg by mouth 2 (two) times daily.   dicyclomine 20 MG tablet Commonly known as:  BENTYL Take 1 tablet (20 mg total) by mouth 2 (two) times daily.   esomeprazole 40 MG capsule Commonly known as:  NEXIUM Take 40 mg by mouth daily.   fluticasone 50 MCG/ACT nasal spray Commonly known as:  FLONASE Place 2 sprays into both nostrils daily as needed for allergies. For allergies   gabapentin 800 MG tablet Commonly known as:  NEURONTIN Take 800-1,600 tablets See admin instructions by mouth. Take 800 mg twice daily and 1600 mg at bedtime   glimepiride 4 MG tablet Commonly known as:  AMARYL Take 4 mg by mouth 2 (two) times daily.   JANUVIA 100 MG tablet Generic drug:  sitaGLIPtin Take 100 mg by mouth daily.   LEVEMIR FLEXTOUCH 100 UNIT/ML Pen Generic drug:  Insulin Detemir Inject 20 Units every morning into the skin.   losartan 100 MG tablet Commonly known as:  COZAAR Take 100 mg by mouth daily.   metFORMIN 500 MG tablet Commonly known as:  GLUCOPHAGE Take 1,000-1,500 tablets See admin instructions by mouth. Take 3 tablets by mouth each morning and 2 tablets by mouth each evening.   NOVOLOG FLEXPEN 100 UNIT/ML FlexPen Generic drug:  insulin aspart Inject 20 Units into the skin 2 (two) times daily before a meal.   PARoxetine 30 MG tablet Commonly known as:  PAXIL Take 60 mg by mouth daily.   promethazine 25 MG tablet Commonly known as:  PHENERGAN Take 25 mg by mouth every 4 (four) hours as needed.   simvastatin 40 MG tablet Commonly known as:  ZOCOR Take 40 mg by mouth  daily.   traMADol 50 MG tablet Commonly known as:  ULTRAM Take 50 mg by mouth every 6 (six) hours as needed (For pain.). For fibromyalgia pain   VENTOLIN HFA 108 (90 Base) MCG/ACT inhaler Generic drug:  albuterol Inhale 2 puffs into the lungs every 4 (four) hours as needed for wheezing or shortness of breath. wheezing      Follow-up Information    Cassie Cuevas., MD Follow up in 1 week(s).   Specialty:  Family Medicine Why:  to f/u on BP and the following blood work : Advertising account executive information: [place] [address] [phone]          Allergies  Allergen Reactions  . Penicillins Nausea And Vomiting, Rash and Other (  See Comments)    Has patient had a PCN reaction causing immediate rash, facial/tongue/throat swelling, SOB or lightheadedness with hypotension: yes Has patient had a PCN reaction causing severe rash involving mucus membranes or skin necrosis: no Has patient had a PCN reaction that required hospitalization: no Has patient had a PCN reaction occurring within the last 10 years: yes If all of the above answers are "NO", then may proceed with Cephalosporin use.    . Tape Rash and Other (See Comments)    needs paper tape      Procedures/Studies:   Ct Abdomen Pelvis Wo Contrast  Result Date: 02/25/2017 CLINICAL DATA:  Initial evaluation for acute nausea, vomiting, abdominal pain. History of IBS, diabetes, hysterectomy, hernia repair, cholecystectomy. EXAM: CT ABDOMEN AND PELVIS WITHOUT CONTRAST TECHNIQUE: Multidetector CT imaging of the abdomen and pelvis was performed following the standard protocol without IV contrast. COMPARISON:  Prior CT from 06/08/2016. FINDINGS: Lower chest: Visualized lung bases are clear. Hepatobiliary: Limited noncontrast evaluation of the liver is unremarkable. Gallbladder surgically absent. No biliary dilatation. Pancreas: Pancreas within normal limits. Spleen: Spleen within normal limits.  Adrenals/Urinary Tract: Adrenal glands are normal. Left kidney slightly atrophic as compared to the right. 9 mm partially exophytic hypodensity extending from the posterior right kidney noted, indeterminate, but statistically likely reflects a small cyst. Secrete IV contrast material present within the renal collecting systems bilaterally. No hydronephrosis or hydroureter. Partially distended bladder is filled with secrete contrast. Bladder otherwise unremarkable. Stomach/Bowel: Stomach within normal limits. No evidence for bowel obstruction. Appendix normal. No abnormal wall thickening or inflammatory fat stranding seen about the bowels. Moderate colonic stool burden. Vascular/Lymphatic: Mild aortic atherosclerosis. No aneurysm. No pathologically enlarged intra-abdominal or pelvic lymph nodes. Reproductive: Uterus is absent.  Ovaries not discretely identified. Other: No free air or fluid. Sequela prior hernia repair noted. No recurrent hernia. Musculoskeletal: No acute osseus abnormality. No worrisome lytic or blastic osseous lesions. Degenerative disc bulge noted at L5-S1. IMPRESSION: 1. No CT evidence for acute intra-abdominal or pelvic process. 2. Secreted IV contrast within the renal collecting systems and bladder. 3. Moderate colonic stool burden. 4. Mild aortic atherosclerosis. 5. Sequelae of prior cholecystectomy, hysterectomy, and hernia repair. Electronically Signed   By: Jeannine Boga M.D.   On: 02/25/2017 23:47   Dg Chest 2 View  Result Date: 02/25/2017 CLINICAL DATA:  61 year old female with chest pain and shortness of breath for 1 day. EXAM: CHEST  2 VIEW COMPARISON:  11/04/2016 FINDINGS: The heart size and mediastinal contours are within normal limits. A new opacity in the right upper lung has suggestive of a developing infiltrate. Previously noted right basilar opacity no longer identified. The left lung is clear. No sizeable effusions or pneumothorax. The visualized skeletal structures  are unremarkable. IMPRESSION: 1. Interval development of a right upper lobe opacity suggestive of developing infiltrate. Continued follow-up recommended. 2. Interval resolution of right basilar opacity. Electronically Signed   By: Kristopher Oppenheim M.D.   On: 02/25/2017 11:58   Ct Angio Chest Pe W And/or Wo Contrast  Result Date: 02/25/2017 CLINICAL DATA:  Pt to ER as transfer from [facility] for evaluation of acute onset of central/left chest pressure onset 2-3 hours ago while "sitting and laying." reports associated shortness of breath and nausea, also reports epigastric pain. States has been compliant with medications, states her providers have decreased the dosage of "most all of my medicines EXAM: CT ANGIOGRAPHY CHEST WITH CONTRAST TECHNIQUE: Multidetector CT imaging of the chest was performed using the standard  protocol during bolus administration of intravenous contrast. Multiplanar CT image reconstructions and MIPs were obtained to evaluate the vascular anatomy. CONTRAST:  100 mL of Isovue 370 intravenous contrast COMPARISON:  Current chest radiograph FINDINGS: Cardiovascular: Satisfactory opacification of the pulmonary arteries to the segmental level. No evidence of pulmonary embolism. Normal heart size. No pericardial effusion. Minimal left coronary artery calcifications. Aorta is normal in caliber. No dissection. Minor atherosclerotic calcifications along the arch. Mediastinum/Nodes: No enlarged mediastinal, hilar, or axillary lymph nodes. Thyroid gland, trachea, and esophagus demonstrate no significant findings. Lungs/Pleura: Lungs are clear. No pleural effusion or pneumothorax. Upper Abdomen: No acute abnormality. Musculoskeletal: Mild disc degenerative changes along the thoracic spine. No fracture or acute finding. No osteoblastic or osteolytic lesions. Review of the MIP images confirms the above findings. IMPRESSION: 1. No evidence of a pulmonary embolism. 2. No acute findings.  Clear lungs. 3. Minor  coronary artery calcifications. Minor thoracic aortic atherosclerosis. Aortic Atherosclerosis (ICD10-I70.0). Electronically Signed   By: Lajean Manes M.D.   On: 02/25/2017 14:38   Ct Abdomen Pelvis W Contrast  Result Date: 02/22/2017 CLINICAL DATA:  Back pain. Cancer or infection suspected. History of pancreatitis. EXAM: CT ABDOMEN AND PELVIS WITH CONTRAST TECHNIQUE: Multidetector CT imaging of the abdomen and pelvis was performed using the standard protocol following bolus administration of intravenous contrast. CONTRAST:  180mL ISOVUE-300 IOPAMIDOL (ISOVUE-300) INJECTION 61% COMPARISON:  None. FINDINGS: Lower chest: Asymmetric thoracic shape that is likely developmental. Hepatobiliary: No focal liver abnormality.Cholecystectomy which may account for common bile duct prominence (10 mm). On coronal reformats the lower common bile duct appears to loose is low-density lumen, but this is not confirmed on axial slices. Pancreas: Unremarkable. Spleen: Unremarkable. Adrenals/Urinary Tract: Negative adrenals. No hydronephrosis or stone. Left renal atrophy. Calcification along the proximal right ureter is a phlebolith. Subcentimeter right renal cyst. Unremarkable bladder. Stomach/Bowel:  No obstruction. No appendicitis. Vascular/Lymphatic: No acute vascular abnormality. Moderate atheromatous wall thickening of the aorta. No mass or adenopathy. Reproductive:Hysterectomy. Possible oophorectomies. There is a degree of pelvic floor laxity. Other: No ascites or pneumoperitoneum. Musculoskeletal: No acute abnormalities. IMPRESSION: 1. No acute finding to explain abdominal pain. 2. Cholecystectomy which may account for mildly dilated common bile duct. Please correlate with in progress liver function tests. 3. Left renal atrophy. 4.  Aortic Atherosclerosis (ICD10-I70.0). Electronically Signed   By: Monte Fantasia M.D.   On: 02/22/2017 17:22       Discharge Exam: Vitals:   02/26/17 1135 02/26/17 1533  BP: (!) 169/97  (!) 163/102  Pulse: (!) 103 (!) 108  Resp: 13 19  Temp: 98.6 F (37 C) 98.6 F (37 C)  SpO2: 98% 95%   Vitals:   02/26/17 0425 02/26/17 0700 02/26/17 1135 02/26/17 1533  BP: (!) 183/97 (!) 147/93 (!) 169/97 (!) 163/102  Pulse:  (!) 108 (!) 103 (!) 108  Resp:  20 13 19   Temp:  97.9 F (36.6 C) 98.6 F (37 C) 98.6 F (37 C)  TempSrc:  Axillary Oral Oral  SpO2:  98% 98% 95%  Weight:      Height:        General: Pt is alert, awake, not in acute distress Cardiovascular: RRR, S1/S2 +, no rubs, no gallops Respiratory: CTA bilaterally, no wheezing, no rhonchi Abdominal: Soft, NT, mild diffuse tenderness, bowel sounds + Extremities: no edema, no cyanosis    The results of significant diagnostics from this hospitalization (including imaging, microbiology, ancillary and laboratory) are listed below for reference.     Microbiology:  No results found for this or any previous visit (from the past 240 hour(s)).   Labs: BNP (last 3 results) No results for input(s): BNP in the last 8760 hours. Basic Metabolic Panel: Recent Labs  Lab 02/22/17 1615 02/22/17 1621 02/24/17 0747 02/25/17 1437 02/25/17 1714 02/25/17 2112 02/26/17 0910  NA 135 139 134*  --  135  --  132*  K 3.7 3.8 3.5  --  4.7  --  3.9  CL 101 102 97*  --  98*  --  99*  CO2 25  --  23  --  24  --  18*  GLUCOSE 268* 266* 180*  --  197*  --  252*  BUN 12 13 17   --  28*  --  31*  CREATININE 0.95 0.90 0.76  --  1.02* 0.94 0.85  CALCIUM 9.2  --  9.5  --  9.9  --  9.4  MG  --   --  1.6* 2.0  --   --   --    Liver Function Tests: Recent Labs  Lab 02/22/17 1615  AST 30  ALT 28  ALKPHOS 125  BILITOT 0.7  PROT 7.5  ALBUMIN 4.2   Recent Labs  Lab 02/22/17 1615 02/24/17 0747 02/25/17 2112  LIPASE 30 30 30    No results for input(s): AMMONIA in the last 168 hours. CBC: Recent Labs  Lab 02/22/17 1615 02/22/17 1621 02/25/17 1714 02/25/17 2112 02/26/17 0910  WBC 13.0*  --  20.1* 19.6* 17.4*  NEUTROABS  9.6*  --  15.0*  --   --   HGB 14.6 14.3 16.9* 16.2* 16.2*  HCT 41.1 42.0 48.2* 46.4* 45.7  MCV 94.5  --  94.7 93.9 93.1  PLT 256  --  360 319 319   Cardiac Enzymes: Recent Labs  Lab 02/25/17 2112 02/26/17 0251 02/26/17 0910  TROPONINI <0.03 <0.03 <0.03   BNP: Invalid input(s): POCBNP CBG: Recent Labs  Lab 02/25/17 0922 02/25/17 1331 02/26/17 0838 02/26/17 1247 02/26/17 1619  GLUCAP 183* 199* 297* 213* 229*   D-Dimer No results for input(s): DDIMER in the last 72 hours. Hgb A1c Recent Labs    02/25/17 2112  HGBA1C 7.9*   Lipid Profile No results for input(s): CHOL, HDL, LDLCALC, TRIG, CHOLHDL, LDLDIRECT in the last 72 hours. Thyroid function studies No results for input(s): TSH, T4TOTAL, T3FREE, THYROIDAB in the last 72 hours.  Invalid input(s): FREET3 Anemia work up No results for input(s): VITAMINB12, FOLATE, FERRITIN, TIBC, IRON, RETICCTPCT in the last 72 hours. Urinalysis    Component Value Date/Time   COLORURINE YELLOW 02/22/2017 1943   APPEARANCEUR CLEAR 02/22/2017 1943   LABSPEC >1.046 (H) 02/22/2017 1943   PHURINE 5.0 02/22/2017 [place] NEGATIVE 02/22/2017 [place] NEGATIVE 02/22/2017 [place] NEGATIVE 02/22/2017 1943   KETONESUR 5 (A) 02/22/2017 1943   PROTEINUR NEGATIVE 02/22/2017 1943   NITRITE NEGATIVE 02/22/2017 1943   LEUKOCYTESUR NEGATIVE 02/22/2017 1943   Sepsis Labs Invalid input(s): PROCALCITONIN,  WBC,  LACTICIDVEN Microbiology No results found for this or any previous visit (from the past 240 hour(s)).   Time coordinating discharge: Over 30 minutes  SIGNED:   Debbe Odea, MD  [facility] 02/26/2017, 4:50 PM Pager   If 7PM-7AM, please contact night-coverage [email or web address] Password [email or web address]

## 2017-02-26 NOTE — Progress Notes (Signed)
  Echocardiogram 2D Echocardiogram has been performed.  Jannett Celestine 02/26/2017, 9:59 AM

## 2017-02-26 NOTE — Progress Notes (Signed)
Explained and discussed discharge instructions, follow up appointment given and exit care notes given. Called security as her purse and cellphone as still at behavioral health. Security going to get belongings from behavioral health. Then will be ready to be discharged. Will be discharged to home with taxi voucher.

## 2017-02-26 NOTE — Progress Notes (Addendum)
Patient ID: Cassie Cuevas, female   DOB: February 19, 1956, 61 y.o.   MRN: [id number]  Patient's belongings from bedside and from locker were retrieved from [facility] by security and are to be returned to patient prior to her discharging from [facility].

## 2017-02-27 MED FILL — Insulin Detemir Inj 100 Unit/ML: SUBCUTANEOUS | Qty: 0.15 | Status: AC

## 2017-10-23 ENCOUNTER — Encounter (HOSPITAL_COMMUNITY): Payer: Self-pay

## 2017-10-23 ENCOUNTER — Other Ambulatory Visit: Payer: Self-pay

## 2017-10-23 ENCOUNTER — Observation Stay (HOSPITAL_COMMUNITY)
Admission: EM | Admit: 2017-10-23 | Discharge: 2017-10-24 | Discharge status: Against Medical Advice | Payer: Medicare HMO | Attending: Internal Medicine | Admitting: Internal Medicine

## 2017-10-23 ENCOUNTER — Emergency Department (HOSPITAL_COMMUNITY): Payer: Medicare HMO

## 2017-10-23 DIAGNOSIS — F329 Major depressive disorder, single episode, unspecified: Secondary | ICD-10-CM | POA: Diagnosis not present

## 2017-10-23 DIAGNOSIS — Z7951 Long term (current) use of inhaled steroids: Secondary | ICD-10-CM | POA: Insufficient documentation

## 2017-10-23 DIAGNOSIS — Z79899 Other long term (current) drug therapy: Secondary | ICD-10-CM | POA: Diagnosis not present

## 2017-10-23 DIAGNOSIS — Z9049 Acquired absence of other specified parts of digestive tract: Secondary | ICD-10-CM | POA: Insufficient documentation

## 2017-10-23 DIAGNOSIS — J9600 Acute respiratory failure, unspecified whether with hypoxia or hypercapnia: Secondary | ICD-10-CM | POA: Diagnosis present

## 2017-10-23 DIAGNOSIS — Z91040 Latex allergy status: Secondary | ICD-10-CM | POA: Diagnosis not present

## 2017-10-23 DIAGNOSIS — J9601 Acute respiratory failure with hypoxia: Principal | ICD-10-CM | POA: Insufficient documentation

## 2017-10-23 DIAGNOSIS — Z7984 Long term (current) use of oral hypoglycemic drugs: Secondary | ICD-10-CM | POA: Diagnosis not present

## 2017-10-23 DIAGNOSIS — K589 Irritable bowel syndrome without diarrhea: Secondary | ICD-10-CM | POA: Insufficient documentation

## 2017-10-23 DIAGNOSIS — Z90710 Acquired absence of both cervix and uterus: Secondary | ICD-10-CM | POA: Diagnosis not present

## 2017-10-23 DIAGNOSIS — F17210 Nicotine dependence, cigarettes, uncomplicated: Secondary | ICD-10-CM | POA: Diagnosis not present

## 2017-10-23 DIAGNOSIS — Z880 Allergy status to penicillin: Secondary | ICD-10-CM | POA: Insufficient documentation

## 2017-10-23 DIAGNOSIS — R0902 Hypoxemia: Secondary | ICD-10-CM

## 2017-10-23 DIAGNOSIS — J189 Pneumonia, unspecified organism: Secondary | ICD-10-CM | POA: Diagnosis not present

## 2017-10-23 DIAGNOSIS — J9602 Acute respiratory failure with hypercapnia: Secondary | ICD-10-CM

## 2017-10-23 DIAGNOSIS — E1149 Type 2 diabetes mellitus with other diabetic neurological complication: Secondary | ICD-10-CM | POA: Insufficient documentation

## 2017-10-23 DIAGNOSIS — I700 Atherosclerosis of aorta: Secondary | ICD-10-CM | POA: Insufficient documentation

## 2017-10-23 DIAGNOSIS — Z888 Allergy status to other drugs, medicaments and biological substances status: Secondary | ICD-10-CM | POA: Diagnosis not present

## 2017-10-23 DIAGNOSIS — R079 Chest pain, unspecified: Secondary | ICD-10-CM | POA: Diagnosis present

## 2017-10-23 DIAGNOSIS — G8929 Other chronic pain: Secondary | ICD-10-CM | POA: Insufficient documentation

## 2017-10-23 DIAGNOSIS — I10 Essential (primary) hypertension: Secondary | ICD-10-CM | POA: Diagnosis not present

## 2017-10-23 LAB — I-STAT ARTERIAL BLOOD GAS, ED
Acid-Base Excess: 7 mmol/L — ABNORMAL HIGH (ref 0.0–2.0)
BICARBONATE: 33.7 mmol/L — AB (ref 20.0–28.0)
O2 Saturation: 97 %
PH ART: 7.371 (ref 7.350–7.450)
TCO2: 36 mmol/L — ABNORMAL HIGH (ref 22–32)
pCO2 arterial: 58 mmHg — ABNORMAL HIGH (ref 32.0–48.0)
pO2, Arterial: 95 mmHg (ref 83.0–108.0)

## 2017-10-23 LAB — COMPREHENSIVE METABOLIC PANEL
ALK PHOS: 90 U/L (ref 38–126)
ALT: 35 U/L (ref 0–44)
ANION GAP: 8 (ref 5–15)
AST: 33 U/L (ref 15–41)
Albumin: 3.8 g/dL (ref 3.5–5.0)
BILIRUBIN TOTAL: 0.2 mg/dL — AB (ref 0.3–1.2)
BUN: 15 mg/dL (ref 6–20)
CALCIUM: 9.1 mg/dL (ref 8.9–10.3)
CO2: 28 mmol/L (ref 22–32)
Chloride: 103 mmol/L (ref 98–111)
Creatinine, Ser: 0.88 mg/dL (ref 0.44–1.00)
Glucose, Bld: 188 mg/dL — ABNORMAL HIGH (ref 70–99)
POTASSIUM: 3.9 mmol/L (ref 3.5–5.1)
Sodium: 139 mmol/L (ref 135–145)
TOTAL PROTEIN: 6.8 g/dL (ref 6.5–8.1)

## 2017-10-23 LAB — CBC WITH DIFFERENTIAL/PLATELET
ABS IMMATURE GRANULOCYTES: 0 10*3/uL (ref 0.0–0.1)
BASOS PCT: 1 %
Basophils Absolute: 0.1 10*3/uL (ref 0.0–0.1)
Eosinophils Absolute: 0.2 10*3/uL (ref 0.0–0.7)
Eosinophils Relative: 2 %
HCT: 40.5 % (ref 36.0–46.0)
HEMOGLOBIN: 13.3 g/dL (ref 12.0–15.0)
Immature Granulocytes: 0 %
LYMPHS PCT: 38 %
Lymphs Abs: 3.5 10*3/uL (ref 0.7–4.0)
MCH: 33.1 pg (ref 26.0–34.0)
MCHC: 32.8 g/dL (ref 30.0–36.0)
MCV: 100.7 fL — ABNORMAL HIGH (ref 78.0–100.0)
MONOS PCT: 5 %
Monocytes Absolute: 0.5 10*3/uL (ref 0.1–1.0)
NEUTROS PCT: 54 %
Neutro Abs: 5 10*3/uL (ref 1.7–7.7)
Platelets: 200 10*3/uL (ref 150–400)
RBC: 4.02 MIL/uL (ref 3.87–5.11)
RDW: 13 % (ref 11.5–15.5)
WBC: 9.3 10*3/uL (ref 4.0–10.5)

## 2017-10-23 LAB — RAPID URINE DRUG SCREEN, HOSP PERFORMED
Amphetamines: NOT DETECTED
BENZODIAZEPINES: POSITIVE — AB
COCAINE: NOT DETECTED
OPIATES: NOT DETECTED
Tetrahydrocannabinol: NOT DETECTED

## 2017-10-23 LAB — URINALYSIS, ROUTINE W REFLEX MICROSCOPIC
Bilirubin Urine: NEGATIVE
Glucose, UA: NEGATIVE mg/dL
Hgb urine dipstick: NEGATIVE
KETONES UR: NEGATIVE mg/dL
LEUKOCYTES UA: NEGATIVE
NITRITE: NEGATIVE
PROTEIN: NEGATIVE mg/dL
Specific Gravity, Urine: 1.044 — ABNORMAL HIGH (ref 1.005–1.030)
pH: 5 (ref 5.0–8.0)

## 2017-10-23 LAB — PROTIME-INR
INR: 0.98
PROTHROMBIN TIME: 12.9 s (ref 11.4–15.2)

## 2017-10-23 LAB — ETHANOL: Alcohol, Ethyl (B): <10 mg/dL (ref <10)

## 2017-10-23 LAB — GLUCOSE, CAPILLARY: GLUCOSE-CAPILLARY: 114 mg/dL — AB (ref 70–99)

## 2017-10-23 LAB — I-STAT TROPONIN, ED: Troponin i, poc: 0 ng/mL (ref 0.00–0.08)

## 2017-10-23 LAB — AMMONIA: AMMONIA: 33 umol/L (ref 9–35)

## 2017-10-23 LAB — CBG MONITORING, ED: Glucose-Capillary: 182 mg/dL — ABNORMAL HIGH (ref 70–99)

## 2017-10-23 LAB — I-STAT CG4 LACTIC ACID, ED: Lactic Acid, Venous: 1.56 mmol/L (ref 0.5–1.9)

## 2017-10-23 MED ORDER — LEVOFLOXACIN IN D5W 750 MG/150ML IV SOLN
750.0000 mg | Freq: Once | INTRAVENOUS | Status: AC
  Administered 2017-10-23: 750 mg via INTRAVENOUS
  Filled 2017-10-23: qty 150

## 2017-10-23 MED ORDER — PAROXETINE HCL 20 MG PO TABS
60.0000 mg | ORAL_TABLET | Freq: Every day | ORAL | Status: DC
  Administered 2017-10-23 – 2017-10-24 (×2): 60 mg via ORAL
  Filled 2017-10-23 (×2): qty 3

## 2017-10-23 MED ORDER — GABAPENTIN 400 MG PO CAPS
800.0000 mg | ORAL_CAPSULE | Freq: Three times a day (TID) | ORAL | Status: DC
  Administered 2017-10-24 (×2): 800 mg via ORAL
  Filled 2017-10-23 (×3): qty 2

## 2017-10-23 MED ORDER — ALBUTEROL SULFATE (2.5 MG/3ML) 0.083% IN NEBU: 2.5000 mg | INHALATION_SOLUTION | RESPIRATORY_TRACT | Status: DC | PRN

## 2017-10-23 MED ORDER — GABAPENTIN 800 MG PO TABS: 800.0000 mg | ORAL_TABLET | Freq: Three times a day (TID) | ORAL | Status: DC

## 2017-10-23 MED ORDER — IOHEXOL 300 MG/ML  SOLN
100.0000 mL | Freq: Once | INTRAMUSCULAR | Status: AC | PRN
  Administered 2017-10-23: 100 mL via INTRAVENOUS

## 2017-10-23 MED ORDER — INSULIN ASPART 100 UNIT/ML ~~LOC~~ SOLN
0.0000 [IU] | Freq: Three times a day (TID) | SUBCUTANEOUS | Status: DC
  Administered 2017-10-24: 2 [IU] via SUBCUTANEOUS
  Administered 2017-10-24: 3 [IU] via SUBCUTANEOUS

## 2017-10-23 MED ORDER — ONDANSETRON HCL 4 MG PO TABS: 4.0000 mg | ORAL_TABLET | Freq: Four times a day (QID) | ORAL | Status: DC | PRN

## 2017-10-23 MED ORDER — ENOXAPARIN SODIUM 40 MG/0.4ML ~~LOC~~ SOLN
40.0000 mg | SUBCUTANEOUS | Status: DC
  Administered 2017-10-23: 40 mg via SUBCUTANEOUS
  Filled 2017-10-23: qty 0.4

## 2017-10-23 MED ORDER — GABAPENTIN 400 MG PO CAPS
800.0000 mg | ORAL_CAPSULE | Freq: Every day | ORAL | Status: DC
  Administered 2017-10-23: 800 mg via ORAL
  Filled 2017-10-23: qty 2

## 2017-10-23 MED ORDER — LEVOFLOXACIN IN D5W 750 MG/150ML IV SOLN
750.0000 mg | INTRAVENOUS | Status: DC
  Administered 2017-10-24: 750 mg via INTRAVENOUS
  Filled 2017-10-23: qty 150

## 2017-10-23 MED ORDER — ALBUTEROL SULFATE (2.5 MG/3ML) 0.083% IN NEBU
2.5000 mg | INHALATION_SOLUTION | Freq: Four times a day (QID) | RESPIRATORY_TRACT | Status: DC
  Administered 2017-10-23 – 2017-10-24 (×2): 2.5 mg via RESPIRATORY_TRACT
  Filled 2017-10-23 (×2): qty 3

## 2017-10-23 MED ORDER — ACETAMINOPHEN 650 MG RE SUPP: 650.0000 mg | Freq: Four times a day (QID) | RECTAL | Status: DC | PRN

## 2017-10-23 MED ORDER — ACETAMINOPHEN 325 MG PO TABS: 650.0000 mg | ORAL_TABLET | Freq: Four times a day (QID) | ORAL | Status: DC | PRN

## 2017-10-23 MED ORDER — ONDANSETRON HCL 4 MG/2ML IJ SOLN: 4.0000 mg | Freq: Four times a day (QID) | INTRAMUSCULAR | Status: DC | PRN

## 2017-10-23 MED ORDER — LAMOTRIGINE 25 MG PO TABS
25.0000 mg | ORAL_TABLET | Freq: Three times a day (TID) | ORAL | Status: DC
  Administered 2017-10-23 – 2017-10-24 (×3): 25 mg via ORAL
  Filled 2017-10-23 (×3): qty 1

## 2017-10-23 NOTE — ED Provider Notes (Signed)
[place] EMERGENCY DEPARTMENT Provider Note   CSN: [id number] Arrival date & time: 10/23/17  1453     History   Chief Complaint Chief Complaint  Patient presents with  . Abdominal Pain  . Shortness of Breath    HPI Cassie Cuevas is a 62 y.o. female presenting via EMS for acute abdominal pain that began at 10 AM this morning.  Patient states that pain is a sharp pain that radiates from her mid lower abdomen up to her mid epigastric area.  Patient states that pain is worse with palpation, she is taking her normal pain medication without relief.  Patient states that at times the pain causes her shortness of breath.  Additionally when EMS arrived they found her to be hypoxic at 89% on room air.  She was placed on 4 L via nasal cannula and her saturation rose.  Patient states that she is not on home oxygen.  Patient is a current 1 pack a day smoker. Patient states that she has had similar abdominal pain in the past when she had her hiatal hernia which she had repaired.  Patient states that prior to onset of pain she had approximately 1 week of nausea.  Denies history of fever, vomiting, diarrhea, urinary changes.  Patient is lethargic in room, slurring of words and struggling to keep her eyes open.  On initial assessment patient states that she never slurs her speech and that all of this is new for her.  On secondary assessment with Margarita Mail, PA-C, family was now in the room and he states that this is how patient normally acts after she takes her pain medication.  Patient has empty bottles of Valium and other pain medications in her bag. Patient states that approximately 3 weeks ago she tripped and fell onto her face, she states that she has had a daily headache since her fall.  She describes the pain as a throbbing mild pain in the front of her head.  Pain not made worse with light or sound, unchanged for the past 3 weeks.  HPI  Past Medical History:  Diagnosis Date    . Diabetes mellitus without complication ([place])   . Hypercholesterolemia   . Hypertension   . IBS (irritable bowel syndrome)     Patient Active Problem List   Diagnosis Date Noted  . Acute respiratory failure ([place]) 10/23/2017  . CAP (community acquired pneumonia) 10/23/2017  . Chronic pain 10/23/2017  . Acute respiratory failure with hypoxia and hypercapnia ([place]) 10/23/2017  . Dehydration 02/26/2017  . Chest pain 02/25/2017  . Abnormal EKG 02/25/2017  . Vomiting and diarrhea 02/25/2017  . DM (diabetes mellitus), type 2 with neurological complications ([place]) 17/40/8144  . MDD (major depressive disorder), recurrent episode, severe ([place]) 02/22/2017    Past Surgical History:  Procedure Laterality Date  . CHOLECYSTECTOMY    . HERNIA REPAIR  X2  . TOTAL ABDOMINAL HYSTERECTOMY       OB History   None      Home Medications    Prior to Admission medications   Medication Sig Start Date End Date Taking? Authorizing Provider  diazepam (VALIUM) 10 MG tablet Take 5-10 mg by mouth 2 (two) times daily as needed for anxiety.  07/28/15  Yes [provider]  esomeprazole (NEXIUM) 40 MG capsule Take 40 mg by mouth daily.  08/13/15  Yes [provider]  fluticasone (FLONASE) 50 MCG/ACT nasal spray Place 2 sprays into both nostrils daily as needed for allergies.  For allergies  08/13/15  Yes [provider]  gabapentin (NEURONTIN) 800 MG tablet Take 800-1,600 tablets by mouth See admin instructions. Take 800mg  3 times daily before meals & bedtime take 1600mg . 07/26/15  Yes [provider]  glimepiride (AMARYL) 4 MG tablet Take 4 mg by mouth 2 (two) times daily. 01/26/17  Yes [provider]  lamoTRIgine (LAMICTAL) 25 MG tablet Take 25 mg by mouth 3 (three) times daily. 10/07/17  Yes [provider]  metFORMIN (GLUCOPHAGE) 500 MG tablet Take 1,000-1,500 tablets See admin instructions by mouth. Take 3 tablets by mouth each morning and 2 tablets by mouth  each evening. 06/14/15  Yes [provider]  PARoxetine (PAXIL) 30 MG tablet Take 60 mg by mouth at bedtime.  06/11/12  Yes [provider]  promethazine (PHENERGAN) 25 MG tablet Take 25 mg by mouth every 4 (four) hours as needed for nausea or vomiting.  01/22/17  Yes [provider]  VENTOLIN HFA 108 (90 Base) MCG/ACT inhaler Inhale 2 puffs into the lungs every 4 (four) hours as needed for wheezing or shortness of breath. wheezing 08/09/15  Yes [provider]  ZUBSOLV 5.7-1.4 MG SUBL Place 2 tablets under the tongue daily. 10/11/17  Yes [provider]  dicyclomine (BENTYL) 20 MG tablet Take 1 tablet (20 mg total) by mouth 2 (two) times daily. Patient not taking: Reported on 10/23/2017 02/22/17   Margarita Mail, PA-C    Family History Family History  Problem Relation Age of Onset  . Heart Problems Mother 31       CABG    Social History Social History   Tobacco Use  . Smoking status: Current Every Day Smoker    Packs/day: 1.00    Types: Cigarettes  . Smokeless tobacco: Never Used  Substance Use Topics  . Alcohol use: No  . Drug use: No     Allergies   Latex; Penicillins; and Tape   Review of Systems Review of Systems  Constitutional: Negative.  Negative for chills, fatigue and fever.  HENT: Negative.  Negative for congestion, rhinorrhea, sore throat, trouble swallowing and voice change.   Eyes: Negative.  Negative for photophobia and visual disturbance.  Respiratory: Positive for shortness of breath. Negative for cough, wheezing and stridor.   Cardiovascular: Positive for leg swelling. Negative for chest pain and palpitations.  Gastrointestinal: Positive for abdominal pain and nausea. Negative for constipation, diarrhea and vomiting.  Endocrine: Negative.  Negative for polydipsia and polyuria.  Genitourinary: Negative.  Negative for difficulty urinating, flank pain, hematuria, pelvic pain, vaginal discharge and vaginal pain.    Musculoskeletal: Negative.  Negative for back pain, neck pain and neck stiffness.  Skin: Negative.  Negative for rash.  Neurological: Positive for headaches. Negative for dizziness, syncope, weakness and numbness.    Physical Exam Updated Vital Signs BP 94/60   Pulse 81   Temp 97.7 F (36.5 C) (Oral)   Resp 16   Ht 5\' 3"  (1.6 m)   Wt 66.2 kg (146 lb)   SpO2 99%   BMI 25.86 kg/m   Physical Exam  Constitutional: She appears well-developed and well-nourished. She appears lethargic. No distress.  Patient drowsy in bed.  Slurring her words, struggling to keep her eyes open while talking.  Patient speaking constantly in room, not dyspneic at this time.  HENT:  Head: Normocephalic and atraumatic.  Eyes: Pupils are equal, round, and reactive to light. EOM are normal.  Neck: Normal range of motion. Neck supple. No tracheal  deviation present.  Cardiovascular: Normal rate, regular rhythm, normal heart sounds and intact distal pulses.  Pulses:      Dorsalis pedis pulses are 2+ on the right side, and 2+ on the left side.       Posterior tibial pulses are 2+ on the right side, and 2+ on the left side.  Pulmonary/Chest: Effort normal and breath sounds normal. No stridor. No respiratory distress. She has no wheezes. She exhibits no tenderness.  Abdominal: Soft. Bowel sounds are normal. She exhibits no pulsatile midline mass. There is generalized tenderness. There is no rigidity, no rebound and no guarding.  Rectus diastases, possible ventral hernia present.  Musculoskeletal: Normal range of motion.       Right lower leg: She exhibits edema.       Left lower leg: She exhibits edema.  Feet:  Right Foot:  Protective Sensation: 3 sites tested. 3 sites sensed.  Skin Integrity: Positive for ulcer, skin breakdown and erythema.  Left Foot:  Protective Sensation: 3 sites tested. 3 sites sensed.  Skin Integrity: Positive for ulcer, skin breakdown and erythema.  Neurological: She has normal  strength. She appears lethargic. No cranial nerve deficit or sensory deficit. She displays a negative Romberg sign.     ED Treatments / Results  Labs (all labs ordered are listed, but only abnormal results are displayed) Labs Reviewed  CBC WITH DIFFERENTIAL/PLATELET - Abnormal; Notable for the following components:      Result Value   MCV 100.7 (*)    All other components within normal limits  COMPREHENSIVE METABOLIC PANEL - Abnormal; Notable for the following components:   Glucose, Bld 188 (*)    Total Bilirubin 0.2 (*)    All other components within normal limits  URINALYSIS, ROUTINE W REFLEX MICROSCOPIC - Abnormal; Notable for the following components:   Specific Gravity, Urine 1.044 (*)    All other components within normal limits  RAPID URINE DRUG SCREEN, HOSP PERFORMED - Abnormal; Notable for the following components:   Benzodiazepines POSITIVE (*)    Barbiturates   (*)    Value: Result not available. Reagent lot number recalled by manufacturer.   All other components within normal limits  GLUCOSE, CAPILLARY - Abnormal; Notable for the following components:   Glucose-Capillary 114 (*)    All other components within normal limits  CBG MONITORING, ED - Abnormal; Notable for the following components:   Glucose-Capillary 182 (*)    All other components within normal limits  I-STAT ARTERIAL BLOOD GAS, ED - Abnormal; Notable for the following components:   pCO2 arterial 58.0 (*)    Bicarbonate 33.7 (*)    TCO2 36 (*)    Acid-Base Excess 7.0 (*)    All other components within normal limits  CULTURE, EXPECTORATED SPUTUM-ASSESSMENT  GRAM STAIN  AMMONIA  ETHANOL  PROTIME-INR  STREP PNEUMONIAE URINARY ANTIGEN  BASIC METABOLIC PANEL  CBC  LEGIONELLA PNEUMOPHILA SEROGP 1 UR AG  CBC  CREATININE, SERUM  HEPATIC FUNCTION PANEL  TROPONIN I  TROPONIN I  TROPONIN I  I-STAT CG4 LACTIC ACID, ED  I-STAT TROPONIN, ED    EKG EKG Interpretation  Date/Time:  Tuesday October 23 2017  15:01:42 EDT Ventricular Rate:  80 PR Interval:    QRS Duration: 93 QT Interval:  414 QTC Calculation: 478 R Axis:   128 Text Interpretation:  Sinus rhythm Right axis deviation Borderline T abnormalities, anterior leads Confirmed by Pattricia Boss [phone]) on 10/23/2017 6:02:19 PM   Radiology Ct Head Wo Contrast  Result Date: 10/23/2017 CLINICAL DATA:  62 year old female with altered mental status. EXAM: CT HEAD WITHOUT CONTRAST TECHNIQUE: Contiguous axial images were obtained from the base of the skull through the vertex without intravenous contrast. COMPARISON:  09/21/2016 CT and prior studies FINDINGS: Brain: No evidence of acute infarction, hemorrhage, hydrocephalus, extra-axial collection or mass lesion/mass effect. Vascular: No hyperdense vessel or unexpected calcification. Skull: Normal. Negative for fracture or focal lesion. Sinuses/Orbits: No acute finding. Other: None. IMPRESSION: Unremarkable noncontrast head CT Electronically Signed   By: Margarette Canada M.D.   On: 10/23/2017 18:00   Ct Abdomen Pelvis W Contrast  Result Date: 10/23/2017 CLINICAL DATA:  Abdominal pain with shortness of breath EXAM: CT ABDOMEN AND PELVIS WITH CONTRAST TECHNIQUE: Multidetector CT imaging of the abdomen and pelvis was performed using the standard protocol following bolus administration of intravenous contrast. CONTRAST:  167mL OMNIPAQUE IOHEXOL 300 MG/ML  SOLN COMPARISON:  CT 02/25/2017, 02/22/2017, 06/08/2016, 06/01/2016, 08/17/2015 FINDINGS: Lower chest: Lung bases demonstrate partial consolidations and ground-glass densities within the posterior lower lobes. No pleural effusion. Heart size within normal limits. Hepatobiliary: Status post cholecystectomy. Similar appearance of enlarged extrahepatic bile ducts. Pancreas: Unremarkable. No pancreatic ductal dilatation or surrounding inflammatory changes. Spleen: Normal in size without focal abnormality. Adrenals/Urinary Tract: Adrenal glands are within normal limits.  Small cyst right kidney. No hydronephrosis. Bladder unremarkable Stomach/Bowel: Stomach is within normal limits. Appendix appears normal. No evidence of bowel wall thickening, distention, or inflammatory changes. Vascular/Lymphatic: Mild aortic atherosclerosis. No aneurysmal dilatation. No significantly enlarged lymph nodes. Reproductive: Status post hysterectomy. No adnexal masses. Other: Negative for free air or free fluid. Musculoskeletal: Degenerative changes at L5-S1. No acute or suspicious abnormality IMPRESSION: 1. Patchy consolidations and ground-glass densities in the posterior lower lobes, may reflect atelectasis, pneumonia, or aspiration. 2. Otherwise no CT evidence for acute intra-abdominal or pelvic abnormality. 3. Status post cholecystectomy with similar appearance of enlarged extrahepatic bile duct. Electronically Signed   By: Donavan Foil M.D.   On: 10/23/2017 18:07   Dg Chest Port 1 View  Result Date: 10/23/2017 CLINICAL DATA:  Hypoxia.  Generalized chest pain. EXAM: PORTABLE CHEST 1 VIEW COMPARISON:  Chest x-ray 06/18/2017. FINDINGS: Mediastinum and hilar structures normal. Lungs are clear. No pleural effusion or pneumothorax. No acute bony abnormality. IMPRESSION: No acute cardiopulmonary disease. Electronically Signed   By: Marcello Moores  Register   On: 10/23/2017 15:58    Procedures Procedures (including critical care time)  Medications Ordered in ED Medications  lamoTRIgine (LAMICTAL) tablet 25 mg (25 mg Oral Given 10/23/17 2314)  PARoxetine (PAXIL) tablet 60 mg (60 mg Oral Given 10/23/17 2314)  acetaminophen (TYLENOL) tablet 650 mg (has no administration in time range)    Or  acetaminophen (TYLENOL) suppository 650 mg (has no administration in time range)  ondansetron (ZOFRAN) tablet 4 mg (has no administration in time range)    Or  ondansetron (ZOFRAN) injection 4 mg (has no administration in time range)  levofloxacin (LEVAQUIN) IVPB 750 mg (has no administration in time range)   enoxaparin (LOVENOX) injection 40 mg (40 mg Subcutaneous Given 10/23/17 2314)  albuterol (PROVENTIL) (2.5 MG/3ML) 0.083% nebulizer solution 2.5 mg (2.5 mg Nebulization Given 10/23/17 2316)  albuterol (PROVENTIL) (2.5 MG/3ML) 0.083% nebulizer solution 2.5 mg (has no administration in time range)  insulin aspart (novoLOG) injection 0-9 Units (has no administration in time range)  gabapentin (NEURONTIN) capsule 800 mg (800 mg Oral Given 10/23/17 2314)    And  gabapentin (NEURONTIN) capsule 800 mg (has no administration in time range)  iohexol (OMNIPAQUE) 300 MG/ML solution 100 mL (100 mLs Intravenous Contrast Given 10/23/17 1710)  levofloxacin (LEVAQUIN) IVPB 750 mg (0 mg Intravenous Stopped 10/23/17 2205)     Initial Impression / Assessment and Plan / ED Course  I have reviewed the triage vital signs and the nursing notes.  Pertinent labs & imaging results that were available during my care of the patient were reviewed by me and considered in my medical decision making (see chart for details).   CT scan shows possible bilateral lower lobe pneumonia.  Patient started on IV antibiotics in department, Levaquin, patient has allergy to penicillins.  UDS shows positive benzos, patient has prescription bottle for diazepam.  Patient to be admitted for hypoxia in the context of bilateral lower lobe community-acquired pneumonia and need for IV antibiotic therapy.  Admission consult called by Margarita Mail, PA-C.   Final Clinical Impressions(s) / ED Diagnoses   Final diagnoses:  Hypoxia  Community acquired pneumonia, unspecified laterality    ED Discharge Orders    None       Gari Crown 10/23/17 2330    Pattricia Boss, MD 10/24/17 1430

## 2017-10-23 NOTE — ED Notes (Signed)
Pt very drowsy and appears drunk and/or under the influence of drugs or alcohol. Slow speech, constant talking, eyes droopy.

## 2017-10-23 NOTE — H&P (Signed)
History and Physical    Cassie Cuevas HLK:[id number] DOB: December 18, 1955 DOA: 10/23/2017  PCP: [name], MD  Patient coming from: Home.  Chief Complaint: Chest pain shortness of breath and abdominal pain.  HPI: Cassie Cuevas is a 62 y.o. female with history of diabetes mellitus, depression, chronic pain presents to the ER with complaints of chest pain abdominal pain and shortness of breath.  Patient has been having the symptoms since this morning.  Chest pain happens on deep breathing with some nonproductive cough.  Has been having some epigastric abdominal discomfort denies any vomiting.  Denies any diarrhea fever or chills.  In the ER patient is lethargic and ABG shows pH of  ED Course: 7.37 with PCO2 of 58.  Ammonia levels are negative.  Urine drug screen is positive for benzodiazepines CT head is not unremarkable.  Since patient complained of abdominal pain CT abdomen was done which was showing biliary ductal dilation with bilateral lung consolidation concerning for pneumonia versus aspiration.  Patient was placed on Levaquin and admitted for further management.  On my exam patient is lethargic but answers questions appropriately.  Review of Systems: As per HPI, rest all negative.   Past Medical History:  Diagnosis Date  . Diabetes mellitus without complication ([place])   . Hypercholesterolemia   . Hypertension   . IBS (irritable bowel syndrome)     Past Surgical History:  Procedure Laterality Date  . CHOLECYSTECTOMY    . HERNIA REPAIR  X2  . TOTAL ABDOMINAL HYSTERECTOMY       reports that she has been smoking cigarettes.  She has been smoking about 1.00 pack per day. She has never used smokeless tobacco. She reports that she does not drink alcohol or use drugs.  Allergies  Allergen Reactions  . Latex Dermatitis  . Penicillins Nausea And Vomiting, Rash and Other (See Comments)    Has patient had a PCN reaction causing immediate rash, facial/tongue/throat swelling, SOB or  lightheadedness with hypotension: yes Has patient had a PCN reaction causing severe rash involving mucus membranes or skin necrosis: no Has patient had a PCN reaction that required hospitalization: no Has patient had a PCN reaction occurring within the last 10 years: yes If all of the above answers are "NO", then may proceed with Cephalosporin use.    . Tape Rash and Other (See Comments)    needs paper tape     Family History  Problem Relation Age of Onset  . Heart Problems Mother 49       CABG    Prior to Admission medications   Medication Sig Start Date End Date Taking? Authorizing Provider  diazepam (VALIUM) 10 MG tablet Take 5-10 mg by mouth 2 (two) times daily as needed for anxiety.  07/28/15  Yes [provider]  esomeprazole (NEXIUM) 40 MG capsule Take 40 mg by mouth daily.  08/13/15  Yes [provider]  fluticasone (FLONASE) 50 MCG/ACT nasal spray Place 2 sprays into both nostrils daily as needed for allergies. For allergies  08/13/15  Yes [provider]  gabapentin (NEURONTIN) 800 MG tablet Take 800-1,600 tablets by mouth See admin instructions. Take 800mg  3 times daily before meals & bedtime take 1600mg . 07/26/15  Yes [provider]  glimepiride (AMARYL) 4 MG tablet Take 4 mg by mouth 2 (two) times daily. 01/26/17  Yes [provider]  lamoTRIgine (LAMICTAL) 25 MG tablet Take 25 mg by mouth 3 (three) times daily. 10/07/17  Yes [provider]  metFORMIN (  GLUCOPHAGE) 500 MG tablet Take 1,000-1,500 tablets See admin instructions by mouth. Take 3 tablets by mouth each morning and 2 tablets by mouth each evening. 06/14/15  Yes [provider]  PARoxetine (PAXIL) 30 MG tablet Take 60 mg by mouth at bedtime.  06/11/12  Yes [provider]  promethazine (PHENERGAN) 25 MG tablet Take 25 mg by mouth every 4 (four) hours as needed for nausea or vomiting.  01/22/17  Yes [provider]  VENTOLIN HFA 108 (90 Base)  MCG/ACT inhaler Inhale 2 puffs into the lungs every 4 (four) hours as needed for wheezing or shortness of breath. wheezing 08/09/15  Yes [provider]  ZUBSOLV 5.7-1.4 MG SUBL Place 2 tablets under the tongue daily. 10/11/17  Yes [provider]  dicyclomine (BENTYL) 20 MG tablet Take 1 tablet (20 mg total) by mouth 2 (two) times daily. Patient not taking: Reported on 10/23/2017 02/22/17   Margarita Mail, PA-C    Physical Exam: Vitals:   10/23/17 1838 10/23/17 1930 10/23/17 2000 10/23/17 2030  BP: (!) 98/57 98/62 97/60  94/60  Pulse: 72 75 71 80  Resp: 14 12 10 13   Temp:      TempSrc:      SpO2: 97% 95% 93% 96%  Weight:      Height:          Constitutional: Moderately built and nourished. Vitals:   10/23/17 1838 10/23/17 1930 10/23/17 2000 10/23/17 2030  BP: (!) 98/57 98/62 97/60  94/60  Pulse: 72 75 71 80  Resp: 14 12 10 13   Temp:      TempSrc:      SpO2: 97% 95% 93% 96%  Weight:      Height:       Eyes: Anicteric no pallor. ENMT: No discharge from the ears eyes nose or mouth. Neck: No mass palpated no neck rigidity.  No JVD appreciated. Respiratory: No rhonchi or crepitations. Cardiovascular: S1-S2 heard no murmurs appreciated. Abdomen: Soft nontender bowel sounds present. Musculoskeletal: No edema.  No joint effusion. Skin: No rash. Neurologic: Lethargic but oriented to time place and person.  Moves all extremities. Psychiatric: Appears normal.   Labs on Admission: I have personally reviewed following labs and imaging studies  CBC: Recent Labs  Lab 10/23/17 1517  WBC 9.3  NEUTROABS 5.0  HGB 13.3  HCT 40.5  MCV 100.7*  PLT 540   Basic Metabolic Panel: Recent Labs  Lab 10/23/17 1517  NA 139  K 3.9  CL 103  CO2 28  GLUCOSE 188*  BUN 15  CREATININE 0.88  CALCIUM 9.1   GFR: Estimated Creatinine Clearance: 62.9 mL/min (by C-G formula based on SCr of 0.88 mg/dL). Liver Function Tests: Recent Labs  Lab 10/23/17 1517  AST 33  ALT  35  ALKPHOS 90  BILITOT 0.2*  PROT 6.8  ALBUMIN 3.8   No results for input(s): LIPASE, AMYLASE in the last 168 hours. Recent Labs  Lab 10/23/17 1517  AMMONIA 33   Coagulation Profile: Recent Labs  Lab 10/23/17 1531  INR 0.98   Cardiac Enzymes: No results for input(s): CKTOTAL, CKMB, CKMBINDEX, TROPONINI in the last 168 hours. BNP (last 3 results) No results for input(s): PROBNP in the last 8760 hours. HbA1C: No results for input(s): HGBA1C in the last 72 hours. CBG: Recent Labs  Lab 10/23/17 1531 10/23/17 2145  GLUCAP 182* 114*   Lipid Profile: No results for input(s): CHOL, HDL, LDLCALC, TRIG, CHOLHDL, LDLDIRECT in the last 72 hours. Thyroid Function Tests: No  results for input(s): TSH, T4TOTAL, FREET4, T3FREE, THYROIDAB in the last 72 hours. Anemia Panel: No results for input(s): VITAMINB12, FOLATE, FERRITIN, TIBC, IRON, RETICCTPCT in the last 72 hours. Urine analysis:    Component Value Date/Time   COLORURINE YELLOW 10/23/2017 1838   APPEARANCEUR CLEAR 10/23/2017 1838   LABSPEC 1.044 (H) 10/23/2017 1838   PHURINE 5.0 10/23/2017 1838   GLUCOSEU NEGATIVE 10/23/2017 1838   HGBUR NEGATIVE 10/23/2017 1838   BILIRUBINUR NEGATIVE 10/23/2017 1838   KETONESUR NEGATIVE 10/23/2017 1838   PROTEINUR NEGATIVE 10/23/2017 1838   NITRITE NEGATIVE 10/23/2017 1838   LEUKOCYTESUR NEGATIVE 10/23/2017 1838   Sepsis Labs: @LABRCNTIP (procalcitonin:4,lacticidven:4) )No results found for this or any previous visit (from the past 240 hour(s)).   Radiological Exams on Admission: Ct Head Wo Contrast  Result Date: 10/23/2017 CLINICAL DATA:  62 year old female with altered mental status. EXAM: CT HEAD WITHOUT CONTRAST TECHNIQUE: Contiguous axial images were obtained from the base of the skull through the vertex without intravenous contrast. COMPARISON:  09/21/2016 CT and prior studies FINDINGS: Brain: No evidence of acute infarction, hemorrhage, hydrocephalus, extra-axial collection or  mass lesion/mass effect. Vascular: No hyperdense vessel or unexpected calcification. Skull: Normal. Negative for fracture or focal lesion. Sinuses/Orbits: No acute finding. Other: None. IMPRESSION: Unremarkable noncontrast head CT Electronically Signed   By: Margarette Canada M.D.   On: 10/23/2017 18:00   Ct Abdomen Pelvis W Contrast  Result Date: 10/23/2017 CLINICAL DATA:  Abdominal pain with shortness of breath EXAM: CT ABDOMEN AND PELVIS WITH CONTRAST TECHNIQUE: Multidetector CT imaging of the abdomen and pelvis was performed using the standard protocol following bolus administration of intravenous contrast. CONTRAST:  118mL OMNIPAQUE IOHEXOL 300 MG/ML  SOLN COMPARISON:  CT 02/25/2017, 02/22/2017, 06/08/2016, 06/01/2016, 08/17/2015 FINDINGS: Lower chest: Lung bases demonstrate partial consolidations and ground-glass densities within the posterior lower lobes. No pleural effusion. Heart size within normal limits. Hepatobiliary: Status post cholecystectomy. Similar appearance of enlarged extrahepatic bile ducts. Pancreas: Unremarkable. No pancreatic ductal dilatation or surrounding inflammatory changes. Spleen: Normal in size without focal abnormality. Adrenals/Urinary Tract: Adrenal glands are within normal limits. Small cyst right kidney. No hydronephrosis. Bladder unremarkable Stomach/Bowel: Stomach is within normal limits. Appendix appears normal. No evidence of bowel wall thickening, distention, or inflammatory changes. Vascular/Lymphatic: Mild aortic atherosclerosis. No aneurysmal dilatation. No significantly enlarged lymph nodes. Reproductive: Status post hysterectomy. No adnexal masses. Other: Negative for free air or free fluid. Musculoskeletal: Degenerative changes at L5-S1. No acute or suspicious abnormality IMPRESSION: 1. Patchy consolidations and ground-glass densities in the posterior lower lobes, may reflect atelectasis, pneumonia, or aspiration. 2. Otherwise no CT evidence for acute intra-abdominal or  pelvic abnormality. 3. Status post cholecystectomy with similar appearance of enlarged extrahepatic bile duct. Electronically Signed   By: Donavan Foil M.D.   On: 10/23/2017 18:07   Dg Chest Port 1 View  Result Date: 10/23/2017 CLINICAL DATA:  Hypoxia.  Generalized chest pain. EXAM: PORTABLE CHEST 1 VIEW COMPARISON:  Chest x-ray 06/18/2017. FINDINGS: Mediastinum and hilar structures normal. Lungs are clear. No pleural effusion or pneumothorax. No acute bony abnormality. IMPRESSION: No acute cardiopulmonary disease. Electronically Signed   By: Marcello Moores  Register   On: 10/23/2017 15:58    EKG: Independently reviewed.  Normal sinus rhythm with nonspecific T wave changes.  Assessment/Plan Active Problems:   Chest pain   DM (diabetes mellitus), type 2 with neurological complications ([place])   Acute respiratory failure ([place])   CAP (community acquired pneumonia)   Chronic pain   Acute respiratory failure with hypoxia  and hypercapnia ([place])    1. Pneumonia -treating this as community-acquired pneumonia with Levaquin.  Check urine for Legionella strep antigen and follow cultures. 2. Lethargy -I have placed patient on CPAP for now.  I am holding off patient's night dose of Neurontin and decreasing the dose to only 800 mg p.o. 3 times daily tomorrow and if patient is more alert awake may reconsider starting her night dose of Neurontin.  Holding off PRN Valium and also her Zunsolv. 3. Chest pain epigastric pain with shortness of breath -we will cycle cardiac markers check d-dimer follow LFTs check 2D echo.  Appears atypical at this time.  Chest pain-free.  Chest pain happens only on deep breathing and on moving. 4. Diabetes mellitus type 2 -we will keep patient on sliding scale coverage and hold off oral hypoglycemics for now. 5. Patient states she takes Lamictal and Paxil for menopausal symptoms.  I have continued that for now.  Note that I have held and decrease some of the home medications as listed #2.   Need to reconsider starting once patient is more alert awake.   DVT prophylaxis: Lovenox. Code Status: Full code. Family Communication: No family at the bedside. Disposition Plan: Home. Consults called: None. Admission status: Inpatient.   Rise Patience MD [facility] Pager [phone].  If 7PM-7AM, please contact night-coverage [email or web address] Password [email or web address]  10/23/2017, 10:40 PM

## 2017-10-23 NOTE — ED Triage Notes (Signed)
Pt arrives to ED from home with complaints of abdominal pain that she states is also causing her shortness of breath since today. Pt had hernia repair 2 years ago and states her hiatal hernia is causing pain and pushing up into her chest causing her shortness of breath. Hx of asthma, smokes a pack of cigarettes per day, 89% on room air, placed on 2L [place] and up to 95%. Pt placed in position of comfort with bed locked and lowered, call bell in reach.

## 2017-10-23 NOTE — Progress Notes (Signed)
RT set up CPAP and placed on patient with 2L O2 bled into circuit. Patient is tolerating well at this time. RT will monitor as needed.

## 2017-10-24 ENCOUNTER — Inpatient Hospital Stay (HOSPITAL_COMMUNITY): Payer: Medicare HMO

## 2017-10-24 DIAGNOSIS — R079 Chest pain, unspecified: Secondary | ICD-10-CM

## 2017-10-24 DIAGNOSIS — I5030 Unspecified diastolic (congestive) heart failure: Secondary | ICD-10-CM | POA: Diagnosis not present

## 2017-10-24 DIAGNOSIS — J189 Pneumonia, unspecified organism: Secondary | ICD-10-CM | POA: Diagnosis not present

## 2017-10-24 LAB — CBC
HCT: 39.7 % (ref 36.0–46.0)
HEMATOCRIT: 39.1 % (ref 36.0–46.0)
HEMOGLOBIN: 13 g/dL (ref 12.0–15.0)
Hemoglobin: 12.7 g/dL (ref 12.0–15.0)
MCH: 32.3 pg (ref 26.0–34.0)
MCH: 32.5 pg (ref 26.0–34.0)
MCHC: 32.5 g/dL (ref 30.0–36.0)
MCHC: 32.7 g/dL (ref 30.0–36.0)
MCV: 99.5 fL (ref 78.0–100.0)
MCV: 99.3 fL (ref 78.0–100.0)
PLATELETS: 181 10*3/uL (ref 150–400)
Platelets: 192 10*3/uL (ref 150–400)
RBC: 3.93 MIL/uL (ref 3.87–5.11)
RBC: 4 MIL/uL (ref 3.87–5.11)
RDW: 12.9 % (ref 11.5–15.5)
RDW: 12.8 % (ref 11.5–15.5)
WBC: 8.1 10*3/uL (ref 4.0–10.5)
WBC: 9 10*3/uL (ref 4.0–10.5)

## 2017-10-24 LAB — GLUCOSE, CAPILLARY
Glucose-Capillary: 102 mg/dL — ABNORMAL HIGH (ref 70–99)
Glucose-Capillary: 195 mg/dL — ABNORMAL HIGH (ref 70–99)
Glucose-Capillary: 209 mg/dL — ABNORMAL HIGH (ref 70–99)

## 2017-10-24 LAB — BASIC METABOLIC PANEL
ANION GAP: 10 (ref 5–15)
BUN: 12 mg/dL (ref 6–20)
CALCIUM: 8.7 mg/dL — AB (ref 8.9–10.3)
CO2: 27 mmol/L (ref 22–32)
Chloride: 103 mmol/L (ref 98–111)
Creatinine, Ser: 0.85 mg/dL (ref 0.44–1.00)
Glucose, Bld: 92 mg/dL (ref 70–99)
Potassium: 3.9 mmol/L (ref 3.5–5.1)
SODIUM: 140 mmol/L (ref 135–145)

## 2017-10-24 LAB — HEPATIC FUNCTION PANEL
ALT: 37 U/L (ref 0–44)
AST: 43 U/L — AB (ref 15–41)
Albumin: 3.5 g/dL (ref 3.5–5.0)
Alkaline Phosphatase: 97 U/L (ref 38–126)
TOTAL PROTEIN: 6.4 g/dL — AB (ref 6.5–8.1)
Total Bilirubin: 0.6 mg/dL (ref 0.3–1.2)

## 2017-10-24 LAB — ECHOCARDIOGRAM COMPLETE
Height: 63 in
WEIGHTICAEL: 2317.48 [oz_av]

## 2017-10-24 LAB — CREATININE, SERUM
Creatinine, Ser: 0.79 mg/dL (ref 0.44–1.00)
GFR calc non Af Amer: >60 mL/min (ref >60)

## 2017-10-24 LAB — STREP PNEUMONIAE URINARY ANTIGEN: STREP PNEUMO URINARY ANTIGEN: NEGATIVE

## 2017-10-24 LAB — D-DIMER, QUANTITATIVE (NOT AT ARMC): D DIMER QUANT: 0.3 ug{FEU}/mL (ref 0.00–0.50)

## 2017-10-24 LAB — TROPONIN I
Troponin I: <0.03 ng/mL (ref <0.03)
Troponin I: <0.03 ng/mL (ref <0.03)

## 2017-10-24 MED ORDER — GABAPENTIN 400 MG PO CAPS
400.0000 mg | ORAL_CAPSULE | Freq: Three times a day (TID) | ORAL | Status: DC
  Administered 2017-10-24: 400 mg via ORAL
  Filled 2017-10-24: qty 1

## 2017-10-24 MED ORDER — LORAZEPAM 0.5 MG PO TABS
0.5000 mg | ORAL_TABLET | Freq: Once | ORAL | Status: AC
  Administered 2017-10-24: 0.5 mg via ORAL
  Filled 2017-10-24: qty 1

## 2017-10-24 MED ORDER — NICOTINE 21 MG/24HR TD PT24
21.0000 mg | MEDICATED_PATCH | Freq: Every day | TRANSDERMAL | Status: DC
  Administered 2017-10-24: 21 mg via TRANSDERMAL
  Filled 2017-10-24: qty 1

## 2017-10-24 MED ORDER — ALBUTEROL SULFATE (2.5 MG/3ML) 0.083% IN NEBU: 2.5000 mg | INHALATION_SOLUTION | RESPIRATORY_TRACT | Status: DC | PRN

## 2017-10-24 MED ORDER — GUAIFENESIN ER 600 MG PO TB12
1200.0000 mg | ORAL_TABLET | Freq: Two times a day (BID) | ORAL | Status: DC
  Administered 2017-10-24: 1200 mg via ORAL
  Filled 2017-10-24 (×2): qty 2

## 2017-10-24 NOTE — Progress Notes (Signed)
  Echocardiogram 2D Echocardiogram has been performed.  Madelaine Etienne 10/24/2017, 10:47 AM

## 2017-10-24 NOTE — Care Management CC44 (Signed)
Condition Code 44 Documentation Completed  Patient Details  Name: Cassie Cuevas MRN: [id number] Date of Birth: 03/02/1956   Condition Code 44 given:  Yes Patient signature on Condition Code 44 notice:  Yes Documentation of 2 MD's agreement:  Yes Code 44 added to claim:  Yes    Carles Collet, RN 10/24/2017, 3:26 PM

## 2017-10-24 NOTE — Progress Notes (Signed)
PROGRESS NOTE                                                                                                                                                                                                             Patient Demographics:    Cassie Cuevas, is a 62 y.o. female, DOB - 1956/01/19, QPY:[id number]  Admit date - 10/23/2017   Admitting Physician Rise Patience, MD  Outpatient Primary MD for the patient is [name], MD  LOS - 1   Chief Complaint  Patient presents with  . Abdominal Pain  . Shortness of Breath       Brief Narrative   62 y.o. female with history of diabetes mellitus, depression, chronic pain presents to the ER with complaints of chest pain abdominal pain and shortness of breath. Work up significant for up pneumonia    Subjective:    Cassie Cuevas today has, No headache, No chest pain, No abdominal pain, for some productive phlegm, but denies cough  Assessment  & Plan :    Active Problems:   Chest pain   DM (diabetes mellitus), type 2 with neurological complications ([place])   Acute respiratory failure ([place])   CAP (community acquired pneumonia)   Chronic pain   Acute respiratory failure with hypoxia and hypercapnia ([facility])   Pneumonia  -She does report some productive sputum, will start on Mucinex, incentive spirometry and flutter valve, imaging significant for posterior lobe opacity significant for up pneumonia/aspiration, continue with IV Levaquin, follow blood cultures, follow Legionella, so far strep pneumonia antigen is negative   Encephalopathy  -CT head with no acute findings, this is most likely in the setting of her Neurontin significant dose, will continue to hold her evening dose, and will cut her daytime dose by half, continue to hold Valium and Zunsolv.  Chest pain epigastric pain with shortness of breath -D-dimer is within normal limits, cardiac enzymes within normal limits, this is most likely  chest pain secondary to pneumonia, she denies any chest pain this morning  - follow LFTs check 2D echo.   Diabetes mellitus type 2  -we will keep patient on sliding scale coverage and hold off oral hypoglycemics for now.  Patient states she takes Lamictal and Paxil for menopausal symptoms.  I have continued that for now.      Code Status :  Full  Family Communication  : None at bedside  Disposition Plan  : Home when stable   Consults  : None  Procedures  : none  DVT Prophylaxis  : Lovenox  Lab Results  Component Value Date   PLT 181 10/24/2017    Antibiotics  :    Anti-infectives (From admission, onward)   Start     Dose/Rate Route Frequency Ordered Stop   10/24/17 1800  levofloxacin (LEVAQUIN) IVPB 750 mg     750 mg 100 mL/hr over 90 Minutes Intravenous Every 24 hours 10/23/17 2240 10/29/17 1759   10/23/17 2015  levofloxacin (LEVAQUIN) IVPB 750 mg     750 mg 100 mL/hr over 90 Minutes Intravenous  Once 10/23/17 2011 10/23/17 2205        Objective:   Vitals:   10/24/17 0035 10/24/17 0724 10/24/17 0753 10/24/17 0812  BP: (!) 88/51 119/66    Pulse: (!) 46  69   Resp: 12  18   Temp: 98.2 F (36.8 C)  98.1 F (36.7 C)   TempSrc: Oral  Oral   SpO2: 94%  92% 91%  Weight:      Height:        Wt Readings from Last 3 Encounters:  10/23/17 65.7 kg (144 lb 13.5 oz)  02/25/17 57.1 kg (125 lb 14.1 oz)  10/23/15 66.2 kg (146 lb)     Intake/Output Summary (Last 24 hours) at 10/24/2017 1403 Last data filed at 10/24/2017 0900 Gross per 24 hour  Intake 480 ml  Output -  Net 480 ml     Physical Exam  Awake Alert, Oriented X 3, somnolent, but wakes up and answer questions appropriately Symmetrical Chest wall movement, Good air movement bilaterally, CTAB RRR,No Gallops,Rubs or new Murmurs, No Parasternal Heave +ve B.Sounds, Abd Soft, No tenderness,No rebound - guarding or rigidity. No Cyanosis, Clubbing or edema, No new Rash or bruise      Data Review:     CBC Recent Labs  Lab 10/23/17 1517 10/23/17 2258 10/24/17 0459  WBC 9.3 8.1 9.0  HGB 13.3 12.7 13.0  HCT 40.5 39.1 39.7  PLT 200 192 181  MCV 100.7* 99.5 99.3  MCH 33.1 32.3 32.5  MCHC 32.8 32.5 32.7  RDW 13.0 12.9 12.8  LYMPHSABS 3.5  --   --   MONOABS 0.5  --   --   EOSABS 0.2  --   --   BASOSABS 0.1  --   --     Chemistries  Recent Labs  Lab 10/23/17 1517 10/23/17 2258 10/24/17 0459  NA 139  --  140  K 3.9  --  3.9  CL 103  --  103  CO2 28  --  27  GLUCOSE 188*  --  92  BUN 15  --  12  CREATININE 0.88 0.79 0.85  CALCIUM 9.1  --  8.7*  AST 33  --  43*  ALT 35  --  37  ALKPHOS 90  --  97  BILITOT 0.2*  --  0.6   ------------------------------------------------------------------------------------------------------------------ No results for input(s): CHOL, HDL, LDLCALC, TRIG, CHOLHDL, LDLDIRECT in the last 72 hours.  Lab Results  Component Value Date   HGBA1C 7.9 (H) 02/25/2017   ------------------------------------------------------------------------------------------------------------------ No results for input(s): TSH, T4TOTAL, T3FREE, THYROIDAB in the last 72 hours.  Invalid input(s): FREET3 ------------------------------------------------------------------------------------------------------------------ No results for input(s): VITAMINB12, FOLATE, FERRITIN, TIBC, IRON, RETICCTPCT in the last 72 hours.  Coagulation profile Recent Labs  Lab 10/23/17 1531  INR  0.98    Recent Labs    10/24/17 0459  DDIMER 0.30    Cardiac Enzymes Recent Labs  Lab 10/23/17 2258 10/24/17 0459 10/24/17 1101  TROPONINI <0.03 <0.03 <0.03   ------------------------------------------------------------------------------------------------------------------ No results found for: BNP  Inpatient Medications  Scheduled Meds: . enoxaparin (LOVENOX) injection  40 mg Subcutaneous Q24H  . gabapentin  800 mg Oral QHS   And  . gabapentin  800 mg Oral TID AC  .  insulin aspart  0-9 Units Subcutaneous TID WC  . lamoTRIgine  25 mg Oral TID  . PARoxetine  60 mg Oral QHS   Continuous Infusions: . levofloxacin (LEVAQUIN) IV     PRN Meds:.acetaminophen **OR** acetaminophen, albuterol, ondansetron **OR** ondansetron (ZOFRAN) IV  Micro Results No results found for this or any previous visit (from the past 240 hour(s)).  Radiology Reports Ct Head Wo Contrast  Result Date: 10/23/2017 CLINICAL DATA:  62 year old female with altered mental status. EXAM: CT HEAD WITHOUT CONTRAST TECHNIQUE: Contiguous axial images were obtained from the base of the skull through the vertex without intravenous contrast. COMPARISON:  09/21/2016 CT and prior studies FINDINGS: Brain: No evidence of acute infarction, hemorrhage, hydrocephalus, extra-axial collection or mass lesion/mass effect. Vascular: No hyperdense vessel or unexpected calcification. Skull: Normal. Negative for fracture or focal lesion. Sinuses/Orbits: No acute finding. Other: None. IMPRESSION: Unremarkable noncontrast head CT Electronically Signed   By: Margarette Canada M.D.   On: 10/23/2017 18:00   Ct Abdomen Pelvis W Contrast  Result Date: 10/23/2017 CLINICAL DATA:  Abdominal pain with shortness of breath EXAM: CT ABDOMEN AND PELVIS WITH CONTRAST TECHNIQUE: Multidetector CT imaging of the abdomen and pelvis was performed using the standard protocol following bolus administration of intravenous contrast. CONTRAST:  134mL OMNIPAQUE IOHEXOL 300 MG/ML  SOLN COMPARISON:  CT 02/25/2017, 02/22/2017, 06/08/2016, 06/01/2016, 08/17/2015 FINDINGS: Lower chest: Lung bases demonstrate partial consolidations and ground-glass densities within the posterior lower lobes. No pleural effusion. Heart size within normal limits. Hepatobiliary: Status post cholecystectomy. Similar appearance of enlarged extrahepatic bile ducts. Pancreas: Unremarkable. No pancreatic ductal dilatation or surrounding inflammatory changes. Spleen: Normal in size  without focal abnormality. Adrenals/Urinary Tract: Adrenal glands are within normal limits. Small cyst right kidney. No hydronephrosis. Bladder unremarkable Stomach/Bowel: Stomach is within normal limits. Appendix appears normal. No evidence of bowel wall thickening, distention, or inflammatory changes. Vascular/Lymphatic: Mild aortic atherosclerosis. No aneurysmal dilatation. No significantly enlarged lymph nodes. Reproductive: Status post hysterectomy. No adnexal masses. Other: Negative for free air or free fluid. Musculoskeletal: Degenerative changes at L5-S1. No acute or suspicious abnormality IMPRESSION: 1. Patchy consolidations and ground-glass densities in the posterior lower lobes, may reflect atelectasis, pneumonia, or aspiration. 2. Otherwise no CT evidence for acute intra-abdominal or pelvic abnormality. 3. Status post cholecystectomy with similar appearance of enlarged extrahepatic bile duct. Electronically Signed   By: Donavan Foil M.D.   On: 10/23/2017 18:07   Dg Chest Port 1 View  Result Date: 10/23/2017 CLINICAL DATA:  Hypoxia.  Generalized chest pain. EXAM: PORTABLE CHEST 1 VIEW COMPARISON:  Chest x-ray 06/18/2017. FINDINGS: Mediastinum and hilar structures normal. Lungs are clear. No pleural effusion or pneumothorax. No acute bony abnormality. IMPRESSION: No acute cardiopulmonary disease. Electronically Signed   By: Marcello Moores  Register   On: 10/23/2017 15:58    Emeline Gins Capri Raben M.D on 10/24/2017 at 2:03 PM  Between 7am to 7pm - Pager - [phone]  After 7pm go to [email or web address] - password [facility]  [facility] -  Office  [phone]

## 2017-10-24 NOTE — Care Management Obs Status (Signed)
[facility] NOTIFICATION   Patient Details  Name: Cassie Cuevas MRN: [id number] Date of Birth: 06-26-1955   Medicare Observation Status Notification Given:  Yes    Carles Collet, RN 10/24/2017, 3:26 PM
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
## 2017-10-25 LAB — LEGIONELLA PNEUMOPHILA SEROGP 1 UR AG: L. pneumophila Serogp 1 Ur Ag: NEGATIVE

## 2017-10-25 NOTE — Discharge Summary (Signed)
Cassie Cuevas, is a 62 y.o. female  DOB January 04, 1956  MRN [id number].  Admission date:  10/23/2017  Admitting Physician  Rise Patience, MD  Discharge Date:  10/25/2017   Primary MD  [name], MD  Recommendations for primary care physician for things to follow:  - she left AMA   Admission Diagnosis  Hypoxia [R09.02] Community acquired pneumonia, unspecified laterality [J18.9]   Discharge Diagnosis  Hypoxia [R09.02] Community acquired pneumonia, unspecified laterality [J18.9]    Active Problems:   Chest pain   DM (diabetes mellitus), type 2 with neurological complications ([place])   Acute respiratory failure ([place])   CAP (community acquired pneumonia)   Chronic pain   Acute respiratory failure with hypoxia and hypercapnia ([place])      Past Medical History:  Diagnosis Date  . Diabetes mellitus without complication ([place])   . Hypercholesterolemia   . Hypertension   . IBS (irritable bowel syndrome)     Past Surgical History:  Procedure Laterality Date  . CHOLECYSTECTOMY    . HERNIA REPAIR  X2  . TOTAL ABDOMINAL HYSTERECTOMY         History of present illness and  Hospital Course:     Kindly see H&P for history of present illness and admission details, please review complete Labs, Consult reports and Test reports for all details in brief  HPI  from the history and physical done on the day of admission  HPI: Cassie Cuevas is a 62 y.o. female with history of diabetes mellitus, depression, chronic pain presents to the ER with complaints of chest pain abdominal pain and shortness of breath.  Patient has been having the symptoms since this morning.  Chest pain happens on deep breathing with some nonproductive cough.  Has been having some epigastric abdominal discomfort denies any vomiting.  Denies any diarrhea fever or chills.  In the ER patient is lethargic and ABG shows pH of  ED  Course: 7.37 with PCO2 of 58.  Ammonia levels are negative.  Urine drug screen is positive for benzodiazepines CT head is not unremarkable.  Since patient complained of abdominal pain CT abdomen was done which was showing biliary ductal dilation with bilateral lung consolidation concerning for pneumonia versus aspiration.  Patient was placed on Levaquin and admitted for further management.  On my exam patient is lethargic but answers questions appropriately.      Hospital Course  62 y.o.femalewithhistory of diabetes mellitus, depression, chronic pain presents to the ER with complaints of chest pain abdominal pain and shortness of breath. Work up significant for up pneumonia, was kept on Levaquin during hospital stay, was found to be smoking cigarettes in the bathroom overnight, and pulled her IV out, stated she wanted to leave [place].        Discharge Condition:  Left AMA      Discharge Instructions  and  Discharge Medications      Allergies as of 10/24/2017      Reactions   Latex Dermatitis   Penicillins Nausea  And Vomiting, Rash, Other (See Comments)   Has patient had a PCN reaction causing immediate rash, facial/tongue/throat swelling, SOB or lightheadedness with hypotension: yes Has patient had a PCN reaction causing severe rash involving mucus membranes or skin necrosis: no Has patient had a PCN reaction that required hospitalization: no Has patient had a PCN reaction occurring within the last 10 years: yes If all of the above answers are "NO", then may proceed with Cephalosporin use.   Tape Rash, Other (See Comments)   needs paper tape      Medication List    ASK your doctor about these medications   diazepam 10 MG tablet Commonly known as:  VALIUM Take 5-10 mg by mouth 2 (two) times daily as needed for anxiety.   dicyclomine 20 MG tablet Commonly known as:  BENTYL Take 1 tablet (20 mg total) by mouth 2 (two) times daily.   esomeprazole 40 MG  capsule Commonly known as:  NEXIUM Take 40 mg by mouth daily.   fluticasone 50 MCG/ACT nasal spray Commonly known as:  FLONASE Place 2 sprays into both nostrils daily as needed for allergies. For allergies   gabapentin 800 MG tablet Commonly known as:  NEURONTIN Take 800-1,600 tablets by mouth See admin instructions. Take 800mg  3 times daily before meals & bedtime take 1600mg .   glimepiride 4 MG tablet Commonly known as:  AMARYL Take 4 mg by mouth 2 (two) times daily.   lamoTRIgine 25 MG tablet Commonly known as:  LAMICTAL Take 25 mg by mouth 3 (three) times daily.   metFORMIN 500 MG tablet Commonly known as:  GLUCOPHAGE Take 1,000-1,500 tablets See admin instructions by mouth. Take 3 tablets by mouth each morning and 2 tablets by mouth each evening.   PARoxetine 30 MG tablet Commonly known as:  PAXIL Take 60 mg by mouth at bedtime.   promethazine 25 MG tablet Commonly known as:  PHENERGAN Take 25 mg by mouth every 4 (four) hours as needed for nausea or vomiting.   VENTOLIN HFA 108 (90 Base) MCG/ACT inhaler Generic drug:  albuterol Inhale 2 puffs into the lungs every 4 (four) hours as needed for wheezing or shortness of breath. wheezing   ZUBSOLV 5.7-1.4 MG Subl Generic drug:  Buprenorphine HCl-Naloxone HCl Place 2 tablets under the tongue daily.         Diet and Activity recommendation: See Discharge Instructions above      Major procedures and Radiology Reports - PLEASE review detailed and final reports for all details, in brief -     Ct Head Wo Contrast  Result Date: 10/23/2017 CLINICAL DATA:  63 year old female with altered mental status. EXAM: CT HEAD WITHOUT CONTRAST TECHNIQUE: Contiguous axial images were obtained from the base of the skull through the vertex without intravenous contrast. COMPARISON:  09/21/2016 CT and prior studies FINDINGS: Brain: No evidence of acute infarction, hemorrhage, hydrocephalus, extra-axial collection or mass lesion/mass  effect. Vascular: No hyperdense vessel or unexpected calcification. Skull: Normal. Negative for fracture or focal lesion. Sinuses/Orbits: No acute finding. Other: None. IMPRESSION: Unremarkable noncontrast head CT Electronically Signed   By: Margarette Canada M.D.   On: 10/23/2017 18:00   Ct Abdomen Pelvis W Contrast  Result Date: 10/23/2017 CLINICAL DATA:  Abdominal pain with shortness of breath EXAM: CT ABDOMEN AND PELVIS WITH CONTRAST TECHNIQUE: Multidetector CT imaging of the abdomen and pelvis was performed using the standard protocol following bolus administration of intravenous contrast. CONTRAST:  154mL OMNIPAQUE IOHEXOL 300 MG/ML  SOLN COMPARISON:  CT  02/25/2017, 02/22/2017, 06/08/2016, 06/01/2016, 08/17/2015 FINDINGS: Lower chest: Lung bases demonstrate partial consolidations and ground-glass densities within the posterior lower lobes. No pleural effusion. Heart size within normal limits. Hepatobiliary: Status post cholecystectomy. Similar appearance of enlarged extrahepatic bile ducts. Pancreas: Unremarkable. No pancreatic ductal dilatation or surrounding inflammatory changes. Spleen: Normal in size without focal abnormality. Adrenals/Urinary Tract: Adrenal glands are within normal limits. Small cyst right kidney. No hydronephrosis. Bladder unremarkable Stomach/Bowel: Stomach is within normal limits. Appendix appears normal. No evidence of bowel wall thickening, distention, or inflammatory changes. Vascular/Lymphatic: Mild aortic atherosclerosis. No aneurysmal dilatation. No significantly enlarged lymph nodes. Reproductive: Status post hysterectomy. No adnexal masses. Other: Negative for free air or free fluid. Musculoskeletal: Degenerative changes at L5-S1. No acute or suspicious abnormality IMPRESSION: 1. Patchy consolidations and ground-glass densities in the posterior lower lobes, may reflect atelectasis, pneumonia, or aspiration. 2. Otherwise no CT evidence for acute intra-abdominal or pelvic  abnormality. 3. Status post cholecystectomy with similar appearance of enlarged extrahepatic bile duct. Electronically Signed   By: Donavan Foil M.D.   On: 10/23/2017 18:07   Dg Chest Port 1 View  Result Date: 10/23/2017 CLINICAL DATA:  Hypoxia.  Generalized chest pain. EXAM: PORTABLE CHEST 1 VIEW COMPARISON:  Chest x-ray 06/18/2017. FINDINGS: Mediastinum and hilar structures normal. Lungs are clear. No pleural effusion or pneumothorax. No acute bony abnormality. IMPRESSION: No acute cardiopulmonary disease. Electronically Signed   By: Marcello Moores  Register   On: 10/23/2017 15:58    Micro Results     No results found for this or any previous visit (from the past 240 hour(s)).       Objective:   Blood pressure (!) 103/57, pulse 69, temperature 98.3 F (36.8 C), temperature source Oral, resp. rate 18, height 5\' 3"  (1.6 m), weight 65.7 kg (144 lb 13.5 oz), SpO2 91 %.  No intake or output data in the 24 hours ending 10/25/17 1442  Exam Patient left AMA overnight  Data Review   CBC w Diff:  Lab Results  Component Value Date   WBC 9.0 10/24/2017   HGB 13.0 10/24/2017   HCT 39.7 10/24/2017   PLT 181 10/24/2017   LYMPHOPCT 38 10/23/2017   MONOPCT 5 10/23/2017   EOSPCT 2 10/23/2017   BASOPCT 1 10/23/2017    CMP:  Lab Results  Component Value Date   NA 140 10/24/2017   K 3.9 10/24/2017   CL 103 10/24/2017   CO2 27 10/24/2017   BUN 12 10/24/2017   CREATININE 0.85 10/24/2017   PROT 6.4 (L) 10/24/2017   ALBUMIN 3.5 10/24/2017   BILITOT 0.6 10/24/2017   ALKPHOS 97 10/24/2017   AST 43 (H) 10/24/2017   ALT 37 10/24/2017  .    Phillips Climes M.D on 10/25/2017 at 2:42 PM  [facility]   Office  [phone]

## 2018-06-21 ENCOUNTER — Emergency Department (HOSPITAL_COMMUNITY)
Admission: EM | Admit: 2018-06-21 | Discharge: 2018-06-21 | Disposition: A | Discharge status: Home / Self Care | Payer: Medicare HMO | Attending: Emergency Medicine | Admitting: Emergency Medicine

## 2018-06-21 ENCOUNTER — Emergency Department (HOSPITAL_COMMUNITY): Payer: Medicare HMO

## 2018-06-21 ENCOUNTER — Encounter (HOSPITAL_COMMUNITY): Payer: Self-pay | Admitting: Emergency Medicine

## 2018-06-21 ENCOUNTER — Other Ambulatory Visit: Payer: Self-pay

## 2018-06-21 DIAGNOSIS — K5900 Constipation, unspecified: Secondary | ICD-10-CM | POA: Insufficient documentation

## 2018-06-21 DIAGNOSIS — R1084 Generalized abdominal pain: Secondary | ICD-10-CM | POA: Diagnosis present

## 2018-06-21 DIAGNOSIS — I10 Essential (primary) hypertension: Secondary | ICD-10-CM | POA: Diagnosis not present

## 2018-06-21 DIAGNOSIS — E119 Type 2 diabetes mellitus without complications: Secondary | ICD-10-CM | POA: Insufficient documentation

## 2018-06-21 DIAGNOSIS — Z79899 Other long term (current) drug therapy: Secondary | ICD-10-CM | POA: Insufficient documentation

## 2018-06-21 DIAGNOSIS — Z91040 Latex allergy status: Secondary | ICD-10-CM | POA: Insufficient documentation

## 2018-06-21 DIAGNOSIS — F17210 Nicotine dependence, cigarettes, uncomplicated: Secondary | ICD-10-CM | POA: Insufficient documentation

## 2018-06-21 DIAGNOSIS — R110 Nausea: Secondary | ICD-10-CM

## 2018-06-21 HISTORY — DX: Fibromyalgia: M79.7

## 2018-06-21 HISTORY — DX: Major depressive disorder, single episode, unspecified: F32.9

## 2018-06-21 HISTORY — DX: Depression, unspecified: F32.A

## 2018-06-21 HISTORY — DX: Bipolar disorder, unspecified: F31.9

## 2018-06-21 LAB — COMPREHENSIVE METABOLIC PANEL
ALT: 56 U/L — AB (ref 0–44)
AST: 53 U/L — AB (ref 15–41)
Albumin: 4.1 g/dL (ref 3.5–5.0)
Alkaline Phosphatase: 79 U/L (ref 38–126)
Anion gap: 13 (ref 5–15)
BUN: 14 mg/dL (ref 6–20)
CHLORIDE: 100 mmol/L (ref 98–111)
CO2: 24 mmol/L (ref 22–32)
Calcium: 9.6 mg/dL (ref 8.9–10.3)
Creatinine, Ser: 0.75 mg/dL (ref 0.44–1.00)
Glucose, Bld: 210 mg/dL — ABNORMAL HIGH (ref 70–99)
Potassium: 3.7 mmol/L (ref 3.5–5.1)
Sodium: 137 mmol/L (ref 135–145)
Total Bilirubin: 0.8 mg/dL (ref 0.3–1.2)
Total Protein: 7.4 g/dL (ref 6.5–8.1)

## 2018-06-21 LAB — URINALYSIS, ROUTINE W REFLEX MICROSCOPIC
BILIRUBIN URINE: NEGATIVE
Glucose, UA: >=500 mg/dL — AB
Hgb urine dipstick: NEGATIVE
Ketones, ur: 5 mg/dL — AB
Leukocytes,Ua: NEGATIVE
Nitrite: NEGATIVE
PROTEIN: 100 mg/dL — AB
Specific Gravity, Urine: 1.027 (ref 1.005–1.030)
pH: 6 (ref 5.0–8.0)

## 2018-06-21 LAB — CBC
HEMATOCRIT: 37.7 % (ref 36.0–46.0)
HEMOGLOBIN: 12.8 g/dL (ref 12.0–15.0)
MCH: 32.5 pg (ref 26.0–34.0)
MCHC: 34 g/dL (ref 30.0–36.0)
MCV: 95.7 fL (ref 80.0–100.0)
NRBC: 0 % (ref 0.0–0.2)
Platelets: 195 10*3/uL (ref 150–400)
RBC: 3.94 MIL/uL (ref 3.87–5.11)
RDW: 12.6 % (ref 11.5–15.5)
WBC: 8.8 10*3/uL (ref 4.0–10.5)

## 2018-06-21 LAB — LIPASE, BLOOD: LIPASE: 25 U/L (ref 11–51)

## 2018-06-21 LAB — I-STAT BETA HCG BLOOD, ED (MC, WL, AP ONLY)

## 2018-06-21 MED ORDER — SODIUM CHLORIDE 0.9 % IV BOLUS
1000.0000 mL | Freq: Once | INTRAVENOUS | Status: AC
  Administered 2018-06-21: 1000 mL via INTRAVENOUS

## 2018-06-21 MED ORDER — DIAZEPAM 5 MG PO TABS
5.0000 mg | ORAL_TABLET | Freq: Once | ORAL | Status: AC
  Administered 2018-06-21: 5 mg via ORAL
  Filled 2018-06-21: qty 1

## 2018-06-21 MED ORDER — SODIUM CHLORIDE 0.9% FLUSH: 3.0000 mL | Freq: Once | INTRAVENOUS | Status: DC

## 2018-06-21 MED ORDER — MORPHINE SULFATE (PF) 4 MG/ML IV SOLN
4.0000 mg | Freq: Once | INTRAVENOUS | Status: AC
  Administered 2018-06-21: 4 mg via INTRAVENOUS
  Filled 2018-06-21: qty 1

## 2018-06-21 MED ORDER — ONDANSETRON HCL 4 MG/2ML IJ SOLN
4.0000 mg | Freq: Once | INTRAMUSCULAR | Status: AC
  Administered 2018-06-21: 4 mg via INTRAVENOUS
  Filled 2018-06-21: qty 2

## 2018-06-21 MED ORDER — IOHEXOL 300 MG/ML  SOLN
100.0000 mL | Freq: Once | INTRAMUSCULAR | Status: AC | PRN
  Administered 2018-06-21: 100 mL via INTRAVENOUS

## 2018-06-21 MED ORDER — ONDANSETRON 4 MG PO TBDP: 4.0000 mg | ORAL_TABLET | Freq: Three times a day (TID) | ORAL | 0 refills | Status: AC | PRN

## 2018-06-21 MED ORDER — POLYETHYLENE GLYCOL 3350 17 G PO PACK: 17.0000 g | PACK | Freq: Every day | ORAL | 0 refills | Status: DC

## 2018-06-21 NOTE — ED Provider Notes (Signed)
[facility] EMERGENCY DEPARTMENT Provider Note   CSN: [id number] Arrival date & time: 06/21/18  1351    History   Chief Complaint Chief Complaint  Patient presents with  . Abdominal Pain    HPI    Cassie Cuevas is a 63 y.o. female with a PMHx of DM2, bipolar disorder, depression, fibromyalgia, IBS, HTN, HLD, and other conditions listed below, and PSHx of hernia repair, hysterectomy, and cholecystectomy, who presents to the ED with complaints of worsening abdominal pain since yesterday.  Patient states that since she had hernia repair surgery with mesh placement 5 years ago, she has had some abdominal pain ever since, and has had a protruded area on her abdomen (diastases recti).  She states that yesterday her abdominal pain worsened so she decided to come in for evaluation.  Her surgery was done by Dr. Donnal Moat in [facility] but she has not been back to see him.  She describes her pain as 8/10 constant burning and "cutting sensation" generalized throughout her abdomen, nonradiating, worse with having bowel movements, and unrelieved with home gabapentin and Goody's powders.  She reports associated nausea as well as constipation.  She had a bowel movement yesterday which was hard but otherwise "normal" for her.  She reports having history of IBS.  She denies any recent travel, sick contacts, suspicious food intake, alcohol use, or NSAID use.  She also denies having any fevers, chills, chest pain, shortness of breath, vomiting, diarrhea, obstipation, melena, hematochezia, dysuria, hematuria, vaginal bleeding or discharge, numbness, tingling, focal weakness, or any other complaints at this time.  The history is provided by the patient and medical records. No language interpreter was used.  Abdominal Pain  Associated symptoms: constipation and nausea   Associated symptoms: no chest pain, no chills, no diarrhea, no dysuria, no fever, no hematuria, no shortness of breath, no vaginal  bleeding, no vaginal discharge and no vomiting     Past Medical History:  Diagnosis Date  . Bipolar 1 disorder ([place])   . Depression   . Diabetes mellitus without complication ([place])   . Fibromyalgia   . Hypercholesterolemia   . Hypertension   . IBS (irritable bowel syndrome)     Patient Active Problem List   Diagnosis Date Noted  . Acute respiratory failure ([place]) 10/23/2017  . CAP (community acquired pneumonia) 10/23/2017  . Chronic pain 10/23/2017  . Acute respiratory failure with hypoxia and hypercapnia ([place]) 10/23/2017  . Dehydration 02/26/2017  . Chest pain 02/25/2017  . Abnormal EKG 02/25/2017  . Vomiting and diarrhea 02/25/2017  . DM (diabetes mellitus), type 2 with neurological complications ([place]) 65/06/5463  . MDD (major depressive disorder), recurrent episode, severe ([place]) 02/22/2017    Past Surgical History:  Procedure Laterality Date  . CHOLECYSTECTOMY    . HERNIA REPAIR  X2  . TOTAL ABDOMINAL HYSTERECTOMY       OB History   No obstetric history on file.      Home Medications    Prior to Admission medications   Medication Sig Start Date End Date Taking? Authorizing Provider  diazepam (VALIUM) 10 MG tablet Take 5-10 mg by mouth 2 (two) times daily as needed for anxiety.  07/28/15   [provider]  dicyclomine (BENTYL) 20 MG tablet Take 1 tablet (20 mg total) by mouth 2 (two) times daily. Patient not taking: Reported on 10/23/2017 02/22/17   Margarita Mail, PA-C  esomeprazole (NEXIUM) 40 MG capsule Take 40 mg by mouth daily.  08/13/15  [provider]  fluticasone (FLONASE) 50 MCG/ACT nasal spray Place 2 sprays into both nostrils daily as needed for allergies. For allergies  08/13/15   [provider]  gabapentin (NEURONTIN) 800 MG tablet Take 800-1,600 tablets by mouth See admin instructions. Take 800mg  3 times daily before meals & bedtime take 1600mg . 07/26/15   [provider]  glimepiride (AMARYL) 4 MG tablet Take 4 mg by  mouth 2 (two) times daily. 01/26/17   [provider]  lamoTRIgine (LAMICTAL) 25 MG tablet Take 25 mg by mouth 3 (three) times daily. 10/07/17   [provider]  metFORMIN (GLUCOPHAGE) 500 MG tablet Take 1,000-1,500 tablets See admin instructions by mouth. Take 3 tablets by mouth each morning and 2 tablets by mouth each evening. 06/14/15   [provider]  PARoxetine (PAXIL) 30 MG tablet Take 60 mg by mouth at bedtime.  06/11/12   [provider]  promethazine (PHENERGAN) 25 MG tablet Take 25 mg by mouth every 4 (four) hours as needed for nausea or vomiting.  01/22/17   [provider]  VENTOLIN HFA 108 (90 Base) MCG/ACT inhaler Inhale 2 puffs into the lungs every 4 (four) hours as needed for wheezing or shortness of breath. wheezing 08/09/15   [provider]  ZUBSOLV 5.7-1.4 MG SUBL Place 2 tablets under the tongue daily. 10/11/17   [provider]    Family History Family History  Problem Relation Age of Onset  . Heart Problems Mother 77       CABG    Social History Social History   Tobacco Use  . Smoking status: Current Every Day Smoker    Packs/day: 1.00    Types: Cigarettes  . Smokeless tobacco: Never Used  Substance Use Topics  . Alcohol use: No  . Drug use: No     Allergies   Latex; Penicillins; and Tape   Review of Systems Review of Systems  Constitutional: Negative for chills and fever.  Respiratory: Negative for shortness of breath.   Cardiovascular: Negative for chest pain.  Gastrointestinal: Positive for abdominal pain, constipation and nausea. Negative for blood in stool, diarrhea and vomiting.  Genitourinary: Negative for dysuria, hematuria, vaginal bleeding and vaginal discharge.  Musculoskeletal: Negative for arthralgias and myalgias.  Skin: Negative for color change.  Allergic/Immunologic: Positive for immunocompromised state (DM2).  Neurological: Negative for weakness and numbness.    Psychiatric/Behavioral: Negative for confusion.   All other systems reviewed and are negative for acute change except as noted in the HPI.    Physical Exam Updated Vital Signs BP 126/90 (BP Location: Right Arm)   Pulse 94   Temp 98 F (36.7 C) (Oral)   Resp 15   SpO2 94%   Physical Exam Vitals signs and nursing note reviewed.  Constitutional:      General: She is not in acute distress.    Appearance: Normal appearance. She is well-developed. She is not toxic-appearing.     Comments: Afebrile, nontoxic, NAD  HENT:     Head: Normocephalic and atraumatic.  Eyes:     General:        Right eye: No discharge.        Left eye: No discharge.     Conjunctiva/sclera: Conjunctivae normal.  Neck:     Musculoskeletal: Normal range of motion and neck supple.  Cardiovascular:     Rate and Rhythm: Normal rate and regular rhythm.     Pulses: Normal pulses.     Heart sounds: Normal heart  sounds, S1 normal and S2 normal. No murmur. No friction rub. No gallop.   Pulmonary:     Effort: Pulmonary effort is normal. No respiratory distress.     Breath sounds: Normal breath sounds. No decreased breath sounds, wheezing, rhonchi or rales.  Abdominal:     General: Abdomen is protuberant. Bowel sounds are normal. There is no distension.     Palpations: Abdomen is soft. Abdomen is not rigid.     Tenderness: There is abdominal tenderness in the right lower quadrant, epigastric area, periumbilical area, suprapubic area, left upper quadrant and left lower quadrant. There is no right CVA tenderness, left CVA tenderness, guarding or rebound. Negative signs include Murphy's sign and McBurney's sign.     Hernia: A hernia is present. Hernia is present in the ventral area.     Comments: Abdomen protuberant but soft, without discrete distension, +BS throughout, with mild generalized abd TTP throughout all areas except for RUQ, no r/g/r, neg murphy's, neg mcburney's, no CVA TTP. Diastasis recti/ventral hernia noted  with valsalva, easily reduces after valsalva completed.   Musculoskeletal: Normal range of motion.  Skin:    General: Skin is warm and dry.     Findings: No rash.  Neurological:     Mental Status: She is alert and oriented to person, place, and time.     Sensory: Sensation is intact. No sensory deficit.     Motor: Motor function is intact.  Psychiatric:        Mood and Affect: Mood and affect normal.        Behavior: Behavior normal.      ED Treatments / Results  Labs (all labs ordered are listed, but only abnormal results are displayed) Labs Reviewed  COMPREHENSIVE METABOLIC PANEL - Abnormal; Notable for the following components:      Result Value   Glucose, Bld 210 (*)    AST 53 (*)    ALT 56 (*)    All other components within normal limits  URINALYSIS, ROUTINE W REFLEX MICROSCOPIC - Abnormal; Notable for the following components:   APPearance HAZY (*)    Glucose, UA >=500 (*)    Ketones, ur 5 (*)    Protein, ur 100 (*)    Bacteria, UA RARE (*)    All other components within normal limits  LIPASE, BLOOD  CBC  I-STAT BETA HCG BLOOD, ED ([facility], [facility], [facility] ONLY)    EKG None  Radiology Ct Abdomen Pelvis W Contrast  Result Date: 06/21/2018 CLINICAL DATA:  Generalized abdominal pain, constipation EXAM: CT ABDOMEN AND PELVIS WITH CONTRAST TECHNIQUE: Multidetector CT imaging of the abdomen and pelvis was performed using the standard protocol following bolus administration of intravenous contrast. CONTRAST:  128mL OMNIPAQUE IOHEXOL 300 MG/ML  SOLN COMPARISON:  CT 11/16/2017 FINDINGS: Lower chest: Dependent atelectasis or scarring. Heart is normal size. No effusions. Hepatobiliary: Prior cholecystectomy. Diffuse fatty infiltration of the liver. No focal hepatic abnormality. Mildly prominent common bile duct, likely related to post cholecystectomy state, stable. Pancreas: No focal abnormality or ductal dilatation. Spleen: No focal abnormality.  Normal size. Adrenals/Urinary Tract: No  adrenal abnormality. No focal renal abnormality. No stones or hydronephrosis. Urinary bladder is unremarkable. Stomach/Bowel: Moderate stool burden throughout the colon. Normal appendix. Stomach, large and small bowel grossly unremarkable. Vascular/Lymphatic: Aortic atherosclerosis. No enlarged abdominal or pelvic lymph nodes. Reproductive: Prior hysterectomy.  No adnexal masses. Other: No free fluid or free air. Musculoskeletal: No acute bony abnormality. IMPRESSION: Fatty infiltration of the liver. Moderate stool burden  throughout the colon. Aortic atherosclerosis. No acute findings. Electronically Signed   By: Rolm Baptise M.D.   On: 06/21/2018 20:05    Procedures Procedures (including critical care time)  Medications Ordered in ED Medications  sodium chloride flush (NS) 0.9 % injection 3 mL (has no administration in time range)  ondansetron (ZOFRAN) injection 4 mg (4 mg Intravenous Given 06/21/18 1814)  morphine 4 MG/ML injection 4 mg (4 mg Intravenous Given 06/21/18 1813)  sodium chloride 0.9 % bolus 1,000 mL (1,000 mLs Intravenous New Bag/Given 06/21/18 1803)  diazepam (VALIUM) tablet 5 mg (5 mg Oral Given 06/21/18 1926)  iohexol (OMNIPAQUE) 300 MG/ML solution 100 mL (100 mLs Intravenous Contrast Given 06/21/18 1947)     Initial Impression / Assessment and Plan / ED Course  I have reviewed the triage vital signs and the nursing notes.  Pertinent labs & imaging results that were available during my care of the patient were reviewed by me and considered in my medical decision making (see chart for details).        63 y.o. female here with generalized abdominal pain that has been ongoing since her hernia repair surgery several years ago, but worse yesterday.  On exam, abdomen protuberant but not obviously distended, diastases recti noted, adequate bowel sounds throughout, with somewhat diffuse abdominal tenderness throughout all areas but mostly in lower abd and LUQ, none in RUQ, neg  murphy's, no rebound guarding or rigidity.  Work-up thus far reveals: Beta-hCG negative; lipase WNL; CMP with AST 53 and ALT 56 similar to prior and glucose 210; CBC WNL; U/A without evidence of UTI. Will proceed with CT abd/pelv to further evaluate her symptoms. Will give pain/nausea meds and reassess shortly.   8:49 PM CT abd/pelv showing fatty infiltration of liver, moderate stool burden throughout colon, but no other acute findings. Suspect constipation as source of pain/symptoms. Pt feeling better and tolerating PO well here. Will send home with zofran and miralax, advised high fiber diet, increased water intake, and other OTC remedies for symptomatic relief. F/up with PCP in 1wk for recheck. Also advised f/up with surgery service to discuss her concerns about her hernia mesh, she wishes to have referral to new surgeon since she doesn't want to see her prior one. Referral given. Doubt need for further emergent work up at this time. I explained the diagnosis and have given explicit precautions to return to the ER including for any other new or worsening symptoms. The patient understands and accepts the medical plan as it's been dictated and I have answered their questions. Discharge instructions concerning home care and prescriptions have been given. The patient is STABLE and is discharged to home in good condition.    Final Clinical Impressions(s) / ED Diagnoses   Final diagnoses:  Generalized abdominal pain  Nausea  Constipation, unspecified constipation type    ED Discharge Orders         Ordered    polyethylene glycol (MIRALAX / GLYCOLAX) packet  Daily     06/21/18 2048    ondansetron (ZOFRAN ODT) 4 MG disintegrating tablet  Every 8 hours PRN     06/21/18 [address], [place], [place] 06/21/18 2049    Milton Ferguson, MD 06/21/18 2234

## 2018-06-21 NOTE — ED Notes (Signed)
Patient verbalizes understanding of discharge instructions. Opportunity for questioning and answers were provided. Armband removed by staff, pt discharged from ED.  

## 2018-06-21 NOTE — ED Notes (Signed)
UA sent to lab

## 2018-06-21 NOTE — Discharge Instructions (Signed)
Your work up today revealed that you have constipation, which is likely the source of your pain. See the instructions below regarding your constipation. Use miralax as directed below. Use zofran as directed as needed for nausea. Stay well hydrated. Alternate between tylenol and ibuprofen as needed for pain. Follow up with your regular doctor in 1 week for recheck of symptoms. Also follow up with the surgery service to discuss your issues with your hernia mesh. Return to the ER for emergent changes or worsening symptoms.   For your constipation: You should start taking miralax as directed, try starting with using it 1-2 times daily until you achieve daily soft stools-- you can use more than 2 doses daily in order to achieve the first bowel movement and then cut back to 1-2 times daily or however often you need to take it in order to continue having the soft daily stools. May consider over the counter colace as well. Increase the fiber and water intake in your diet. See the information below regarding improving your bowel health. Follow up with your regular doctor in 1 week for recheck of symptoms. Return to the ER for changes or worsening symptoms.   GETTING TO GOOD BOWEL HEALTH. Irregular bowel habits such as constipation and diarrhea can lead to many problems over time.  Having one soft bowel movement a day is the most important way to prevent further problems.  The anorectal canal is designed to handle stretching and feces to safely manage our ability to get rid of solid waste (feces, poop, stool) out of our body.  BUT, hard constipated stools can act like ripping concrete bricks and diarrhea can be a burning fire to this very sensitive area of our body, causing inflamed hemorrhoids, anal fissures, increasing risk is perirectal abscesses, abdominal pain/bloating, an making irritable bowel worse.     The goal: ONE SOFT BOWEL MOVEMENT A DAY!  To have soft, regular bowel movements:  Drink at least 8 tall  glasses of water a day.   Take plenty of fiber.  Fiber is the undigested part of plant food that passes into the colon, acting s natures broom to encourage bowel motility and movement.  Fiber can absorb and hold large amounts of water. This results in a larger, bulkier stool, which is soft and easier to pass. Work gradually over several weeks up to 6 servings a day of fiber (25g a day even more if needed) in the form of: Vegetables -- Root (potatoes, carrots, turnips), leafy green (lettuce, salad greens, celery, spinach), or cooked high residue (cabbage, broccoli, etc) Fruit -- Fresh (unpeeled skin & pulp), Dried (prunes, apricots, cherries, etc ),  or stewed ( applesauce)  Whole grain breads, pasta, etc (whole wheat)  Bran cereals  Bulking Agents -- This type of water-retaining fiber generally is easily obtained each day by one of the following:  Psyllium bran -- The psyllium plant is remarkable because its ground seeds can retain so much water. This product is available as Metamucil, Konsyl, Effersyllium, Per Diem Fiber, or the less expensive generic preparation in drug and health food stores. Although labeled a laxative, it really is not a laxative.  Methylcellulose -- This is another fiber derived from wood which also retains water. It is available as Citrucel. Polyethylene Glycol - and artificial fiber commonly called Miralax or Glycolax.  It is helpful for people with gassy or bloated feelings with regular fiber Flax Seed - a less gassy fiber than psyllium No reading or other relaxing  activity while on the toilet. If bowel movements take longer than 5 minutes, you are too constipated AVOID CONSTIPATION.  High fiber and water intake usually takes care of this.  Sometimes a laxative is needed to stimulate more frequent bowel movements, but  Laxatives are not a good long-term solution as it can wear the colon out. Osmotics (Milk of Magnesia, Fleets phosphosoda, Magnesium citrate, MiraLax,  GoLytely) are safer than  Stimulants (Senokot, Castor Oil, Dulcolax, Ex Lax)    Do not take laxatives for more than 7days in a row.  IF SEVERELY CONSTIPATED, try a Bowel Retraining Program: Do not use laxatives.  Eat a diet high in roughage, such as bran cereals and leafy vegetables.  Drink six (6) ounces of prune or apricot juice each morning.  Eat two (2) large servings of stewed fruit each day.  Take one (1) heaping tablespoon of a psyllium-based bulking agent twice a day. Use sugar-free sweetener when possible to avoid excessive calories.  Eat a normal breakfast.  Set aside 15 minutes after breakfast to sit on the toilet, but do not strain to have a bowel movement.  If you do not have a bowel movement by the third day, use an enema and repeat the above steps.  Controlling diarrhea Switch to liquids and simpler foods for a few days to avoid stressing your intestines further. Avoid dairy products (especially milk & ice cream) for a short time.  The intestines often can lose the ability to digest lactose when stressed. Avoid foods that cause gassiness or bloating.  Typical foods include beans and other legumes, cabbage, broccoli, and dairy foods.  Every person has some sensitivity to other foods, so listen to our body and avoid those foods that trigger problems for you. Adding fiber (Citrucel, Metamucil, psyllium, Miralax) gradually can help thicken stools by absorbing excess fluid and retrain the intestines to act more normally.  Slowly increase the dose over a few weeks.  Too much fiber too soon can backfire and cause cramping & bloating. Probiotics (such as active yogurt, Align, etc) may help repopulate the intestines and colon with normal bacteria and calm down a sensitive digestive tract.  Most studies show it to be of mild help, though, and such products can be costly. Medicines: Bismuth subsalicylate (ex. Kayopectate, Pepto Bismol) every 30 minutes for up to 6 doses can help control  diarrhea.  Avoid if pregnant. Loperamide (Immodium) can slow down diarrhea.  Start with two tablets (4mg  total) first and then try one tablet every 6 hours.  Avoid if you are having fevers or severe pain.  If you are not better or start feeling worse, stop all medicines and call your doctor for advice Call your doctor if you are getting worse or not better.  Sometimes further testing (cultures, endoscopy, X-ray studies, bloodwork, etc) may be needed to help diagnose and treat the cause of the diarrhea.  Managing Pain  Pain after surgery or related to activity is often due to strain/injury to muscle, tendon, nerves and/or incisions.  This pain is usually short-term and will improve in a few months.   Many people find it helpful to do the following things TOGETHER to help speed the process of healing and to get back to regular activity more quickly:  Avoid heavy physical activity  no lifting greater than 20 pounds Do not push through the pain.  Listen to your body and avoid positions and maneuvers than reproduce the pain Walking is okay as tolerated, but go slowly  and stop when getting sore.  Remember: If it hurts to do it, then dont do it! Take Anti-inflammatory medication  Take with food/snack around the clock for 1-2 weeks This helps the muscle and nerve tissues become less irritable and calm down faster Choose ONE of the following over-the-counter medications: Naproxen 220mg  tabs (ex. Aleve) 1-2 pills twice a day  Ibuprofen 200mg  tabs (ex. Advil, Motrin) 3-4 pills with every meal and just before bedtime Acetaminophen 500mg  tabs (Tylenol) 1-2 pills with every meal and just before bedtime Use a Heating pad or Ice/Cold Pack 4-6 times a day May use warm bath/hottub  or showers Try Gentle Massage and/or Stretching  at the area of pain many times a day stop if you feel pain - do not overdo it  Try these steps together to help you body heal faster and avoid making things get worse.  Doing  just one of these things may not be enough.    If you are not getting better after two weeks or are noticing you are getting worse, contact our office for further advice; we may need to re-evaluate you & see what other things we can do to help.

## 2018-06-21 NOTE — ED Triage Notes (Signed)
Patient presents to the ED by [facility] with c/o abdominal pain x2 days. Denies N/V/D has constipation. She reports having a mesh implant placed 5 years ago and has problems since. NAD.

## 2020-05-19 IMAGING — CT CT ABD-PELV W/ CM
2 of 5 series · 17 of 46 positions shown, 19 images · IV contrast (APPLIED)
Comparison: CT 11/16/2017

CLINICAL DATA: Generalized abdominal pain, constipation

EXAM:
CT ABDOMEN AND PELVIS WITH CONTRAST
TECHNIQUE: Multidetector CT imaging of the abdomen and pelvis was performed
using the standard protocol following bolus administration of
intravenous contrast.
CONTRAST:  100mL OMNIPAQUE IOHEXOL 300 MG/ML  SOLN

[Series 3: abd/ pelvis 5.0 i30f 2 · axial · 0.83mm/px · z∈[-688,-262]mm · 14 of 97 slices shown, 16 images]
[im 6/97  soft-tissue]
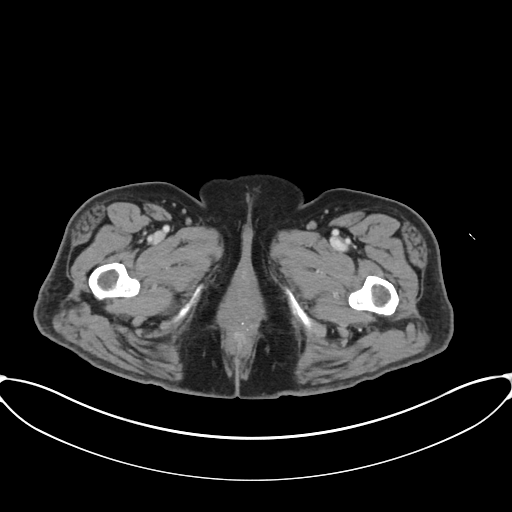
[im 6/97  bone]
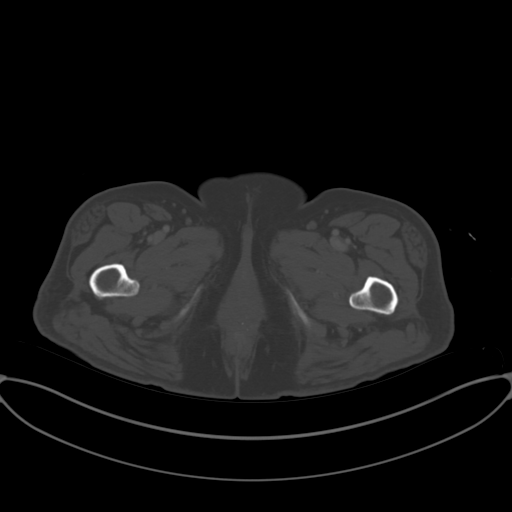
[im 11/97  soft-tissue]
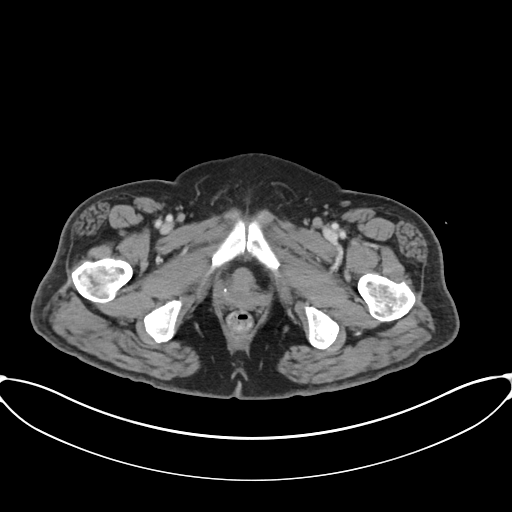
[im 21/97  soft-tissue]
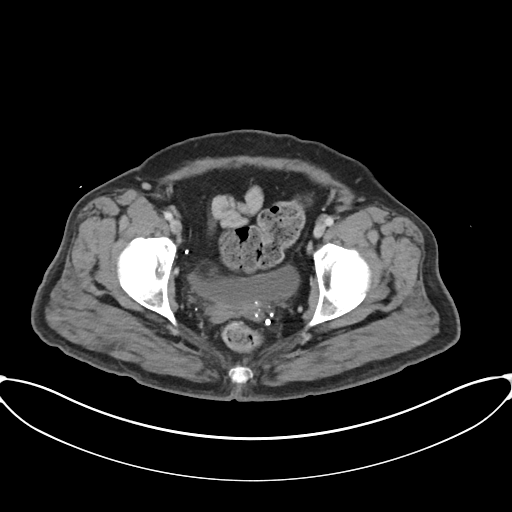
[im 26/97  soft-tissue]
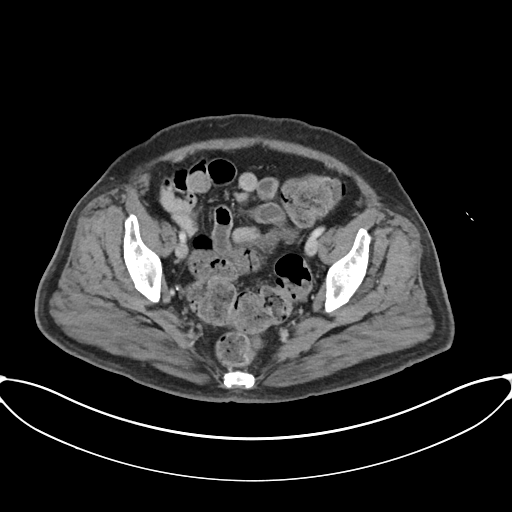
[im 31/97  soft-tissue]
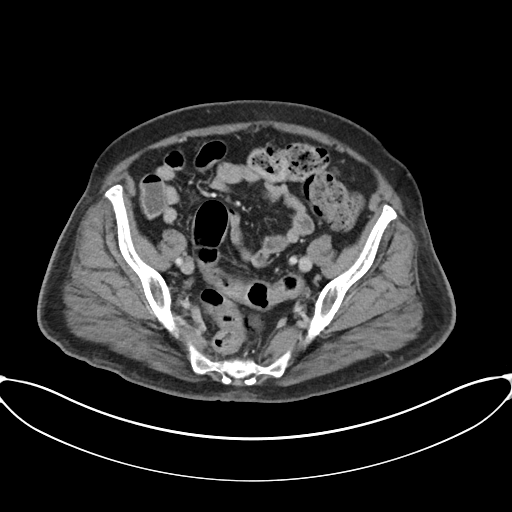
[im 41/97  soft-tissue]
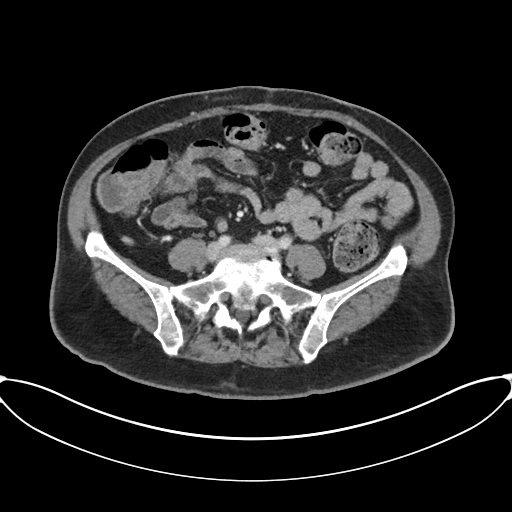
[im 46/97  soft-tissue]
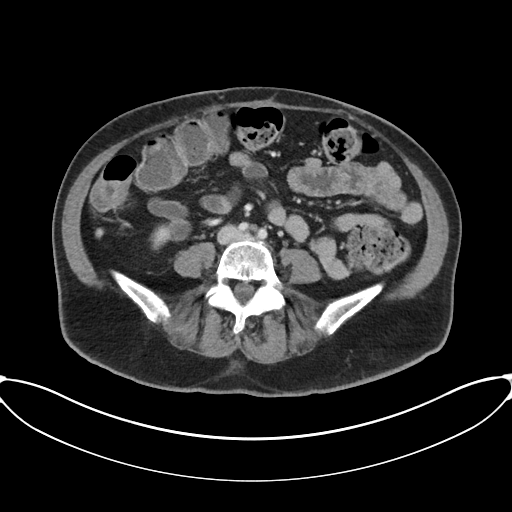
[im 51/97  soft-tissue]
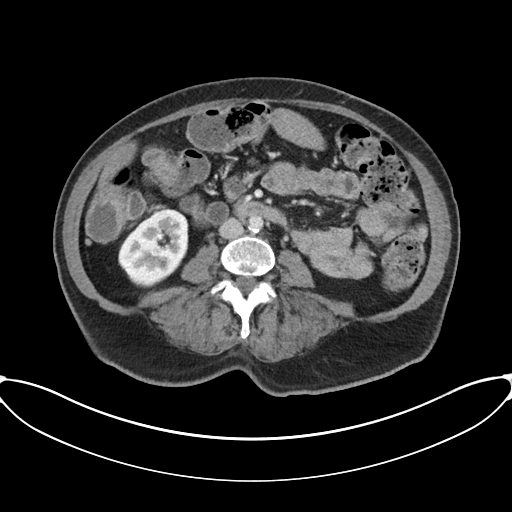
[im 56/97  soft-tissue]
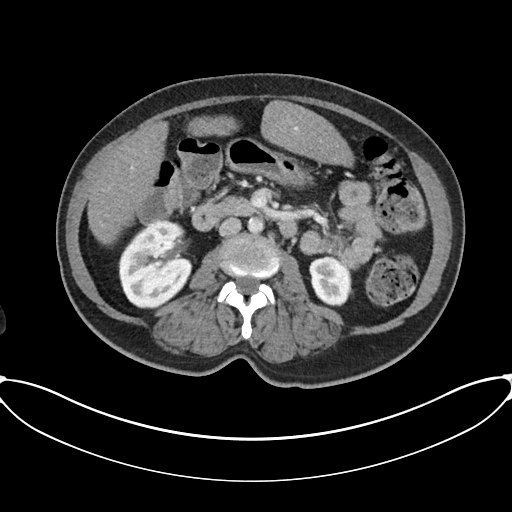
[im 56/97  bone]
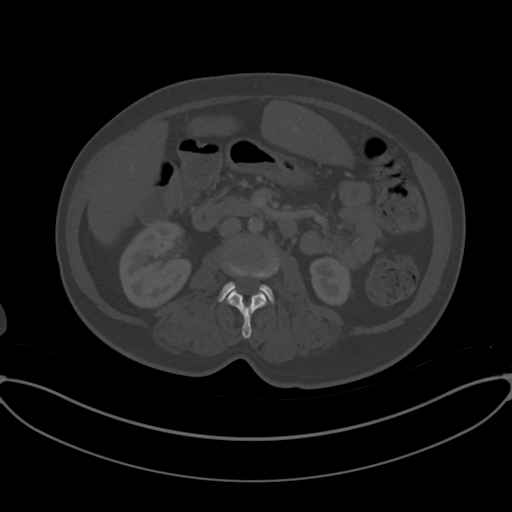
[im 66/97  soft-tissue]
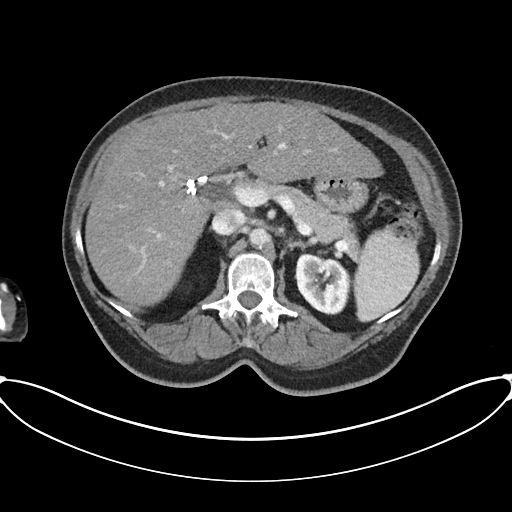
[im 71/97  soft-tissue]
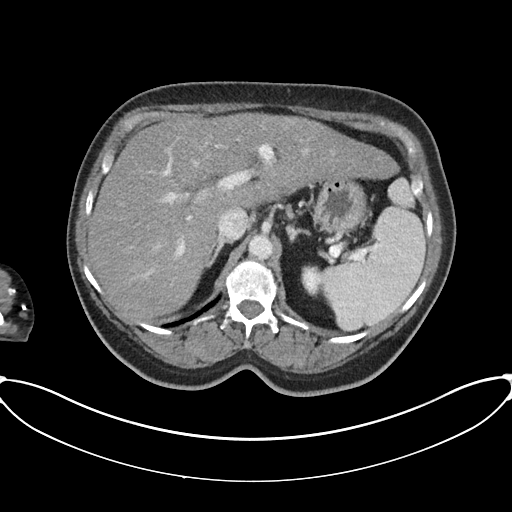
[im 76/97  soft-tissue]
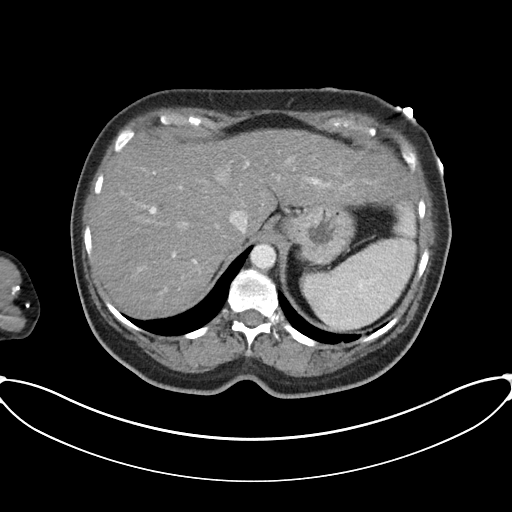
[im 86/97  soft-tissue]
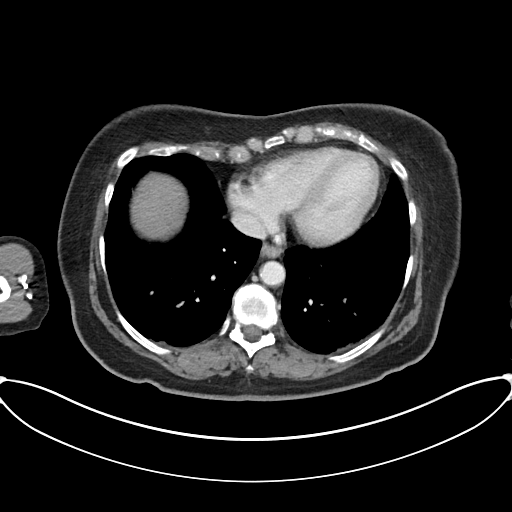
[im 91/97  soft-tissue]
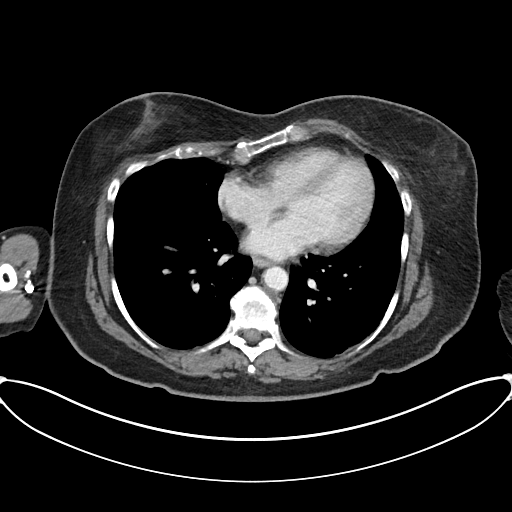

[Series 6: coronal soft tissue · coronal · 0.90mm/px · 3 of 113 slices shown]
[im 38/113  soft-tissue]
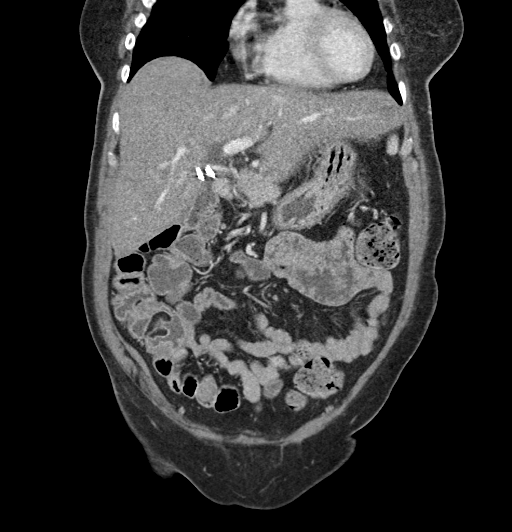
[im 50/113  soft-tissue]
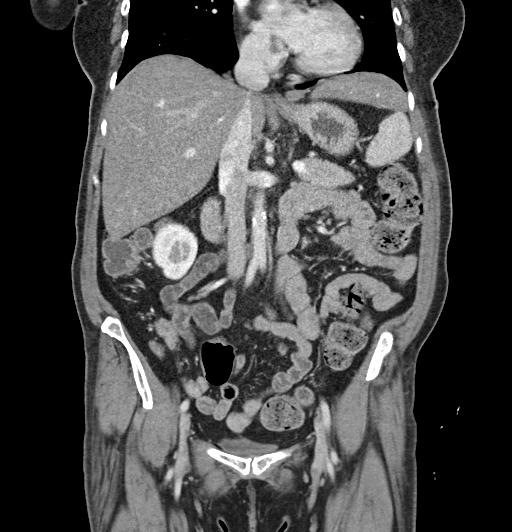
[im 63/113  soft-tissue]
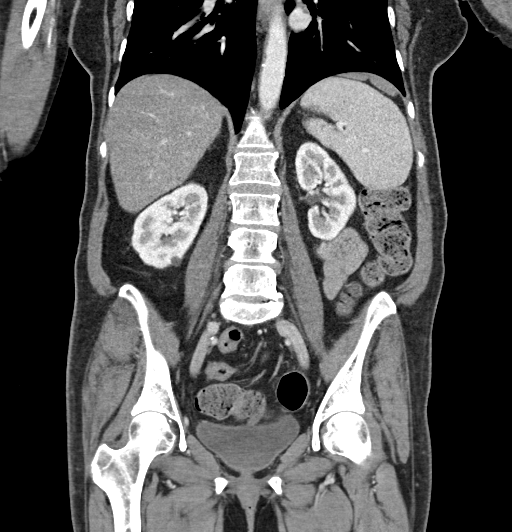

[17 of 46 positions shown; findings below may reference images not displayed]

FINDINGS: Lower chest: Dependent atelectasis or scarring. Heart is normal
size. No effusions.

Hepatobiliary: Prior cholecystectomy. Diffuse fatty infiltration of
the liver. No focal hepatic abnormality. Mildly prominent common
bile duct, likely related to post cholecystectomy state, stable.

Pancreas: No focal abnormality or ductal dilatation.

Spleen: No focal abnormality.  Normal size.

Adrenals/Urinary Tract: No adrenal abnormality. No focal renal
abnormality. No stones or hydronephrosis. Urinary bladder is
unremarkable.

Stomach/Bowel: Moderate stool burden throughout the colon. Normal
appendix. Stomach, large and small bowel grossly unremarkable.

Vascular/Lymphatic: Aortic atherosclerosis. No enlarged abdominal or
pelvic lymph nodes.

Reproductive: Prior hysterectomy.  No adnexal masses.

Other: No free fluid or free air.

Musculoskeletal: No acute bony abnormality.
IMPRESSION: Fatty infiltration of the liver.

Moderate stool burden throughout the colon.

Aortic atherosclerosis.

No acute findings.

## 2020-05-25 DIAGNOSIS — Z794 Long term (current) use of insulin: Secondary | ICD-10-CM | POA: Diagnosis not present

## 2020-05-25 DIAGNOSIS — E119 Type 2 diabetes mellitus without complications: Secondary | ICD-10-CM | POA: Diagnosis not present

## 2020-05-25 DIAGNOSIS — M797 Fibromyalgia: Secondary | ICD-10-CM | POA: Diagnosis not present

## 2020-05-27 DIAGNOSIS — Z6825 Body mass index (BMI) 25.0-25.9, adult: Secondary | ICD-10-CM | POA: Diagnosis not present

## 2020-05-27 DIAGNOSIS — F1120 Opioid dependence, uncomplicated: Secondary | ICD-10-CM | POA: Diagnosis not present

## 2020-05-27 DIAGNOSIS — F319 Bipolar disorder, unspecified: Secondary | ICD-10-CM | POA: Diagnosis not present

## 2020-05-27 DIAGNOSIS — Z79899 Other long term (current) drug therapy: Secondary | ICD-10-CM | POA: Diagnosis not present

## 2020-05-27 DIAGNOSIS — Z79891 Long term (current) use of opiate analgesic: Secondary | ICD-10-CM | POA: Diagnosis not present

## 2020-05-27 DIAGNOSIS — F19980 Other psychoactive substance use, unspecified with psychoactive substance-induced anxiety disorder: Secondary | ICD-10-CM | POA: Diagnosis not present

## 2020-06-10 DIAGNOSIS — F1120 Opioid dependence, uncomplicated: Secondary | ICD-10-CM | POA: Diagnosis not present

## 2020-06-10 DIAGNOSIS — Z79891 Long term (current) use of opiate analgesic: Secondary | ICD-10-CM | POA: Diagnosis not present

## 2020-06-10 DIAGNOSIS — F19980 Other psychoactive substance use, unspecified with psychoactive substance-induced anxiety disorder: Secondary | ICD-10-CM | POA: Diagnosis not present

## 2020-06-10 DIAGNOSIS — F319 Bipolar disorder, unspecified: Secondary | ICD-10-CM | POA: Diagnosis not present

## 2020-06-10 DIAGNOSIS — Z79899 Other long term (current) drug therapy: Secondary | ICD-10-CM | POA: Diagnosis not present

## 2020-06-24 DIAGNOSIS — F19980 Other psychoactive substance use, unspecified with psychoactive substance-induced anxiety disorder: Secondary | ICD-10-CM | POA: Diagnosis not present

## 2020-06-24 DIAGNOSIS — Z6825 Body mass index (BMI) 25.0-25.9, adult: Secondary | ICD-10-CM | POA: Diagnosis not present

## 2020-06-24 DIAGNOSIS — Z79891 Long term (current) use of opiate analgesic: Secondary | ICD-10-CM | POA: Diagnosis not present

## 2020-06-24 DIAGNOSIS — F1120 Opioid dependence, uncomplicated: Secondary | ICD-10-CM | POA: Diagnosis not present

## 2020-06-24 DIAGNOSIS — F319 Bipolar disorder, unspecified: Secondary | ICD-10-CM | POA: Diagnosis not present

## 2020-06-24 DIAGNOSIS — Z79899 Other long term (current) drug therapy: Secondary | ICD-10-CM | POA: Diagnosis not present

## 2020-07-08 DIAGNOSIS — Z6825 Body mass index (BMI) 25.0-25.9, adult: Secondary | ICD-10-CM | POA: Diagnosis not present

## 2020-07-08 DIAGNOSIS — Z79899 Other long term (current) drug therapy: Secondary | ICD-10-CM | POA: Diagnosis not present

## 2020-07-08 DIAGNOSIS — F1120 Opioid dependence, uncomplicated: Secondary | ICD-10-CM | POA: Diagnosis not present

## 2020-07-08 DIAGNOSIS — F319 Bipolar disorder, unspecified: Secondary | ICD-10-CM | POA: Diagnosis not present

## 2020-07-08 DIAGNOSIS — F19980 Other psychoactive substance use, unspecified with psychoactive substance-induced anxiety disorder: Secondary | ICD-10-CM | POA: Diagnosis not present

## 2020-07-08 DIAGNOSIS — Z79891 Long term (current) use of opiate analgesic: Secondary | ICD-10-CM | POA: Diagnosis not present

## 2020-07-26 DIAGNOSIS — M797 Fibromyalgia: Secondary | ICD-10-CM | POA: Diagnosis not present

## 2020-07-26 DIAGNOSIS — F419 Anxiety disorder, unspecified: Secondary | ICD-10-CM | POA: Diagnosis not present

## 2020-07-26 DIAGNOSIS — E781 Pure hyperglyceridemia: Secondary | ICD-10-CM | POA: Diagnosis not present

## 2020-07-26 DIAGNOSIS — E119 Type 2 diabetes mellitus without complications: Secondary | ICD-10-CM | POA: Diagnosis not present

## 2020-07-26 DIAGNOSIS — E1142 Type 2 diabetes mellitus with diabetic polyneuropathy: Secondary | ICD-10-CM | POA: Diagnosis not present

## 2020-07-26 DIAGNOSIS — Z794 Long term (current) use of insulin: Secondary | ICD-10-CM | POA: Diagnosis not present

## 2020-07-26 DIAGNOSIS — F32A Depression, unspecified: Secondary | ICD-10-CM | POA: Diagnosis not present

## 2020-07-26 DIAGNOSIS — Z87891 Personal history of nicotine dependence: Secondary | ICD-10-CM | POA: Diagnosis not present

## 2020-07-26 DIAGNOSIS — B373 Candidiasis of vulva and vagina: Secondary | ICD-10-CM | POA: Diagnosis not present

## 2020-08-02 DIAGNOSIS — Z79891 Long term (current) use of opiate analgesic: Secondary | ICD-10-CM | POA: Diagnosis not present

## 2020-08-02 DIAGNOSIS — Z79899 Other long term (current) drug therapy: Secondary | ICD-10-CM | POA: Diagnosis not present

## 2020-08-02 DIAGNOSIS — Z6825 Body mass index (BMI) 25.0-25.9, adult: Secondary | ICD-10-CM | POA: Diagnosis not present

## 2020-08-02 DIAGNOSIS — F1120 Opioid dependence, uncomplicated: Secondary | ICD-10-CM | POA: Diagnosis not present

## 2020-08-02 DIAGNOSIS — F319 Bipolar disorder, unspecified: Secondary | ICD-10-CM | POA: Diagnosis not present

## 2020-08-02 DIAGNOSIS — F19980 Other psychoactive substance use, unspecified with psychoactive substance-induced anxiety disorder: Secondary | ICD-10-CM | POA: Diagnosis not present

## 2020-08-17 DIAGNOSIS — Z79891 Long term (current) use of opiate analgesic: Secondary | ICD-10-CM | POA: Diagnosis not present

## 2020-08-17 DIAGNOSIS — F319 Bipolar disorder, unspecified: Secondary | ICD-10-CM | POA: Diagnosis not present

## 2020-08-17 DIAGNOSIS — Z6825 Body mass index (BMI) 25.0-25.9, adult: Secondary | ICD-10-CM | POA: Diagnosis not present

## 2020-08-17 DIAGNOSIS — F19980 Other psychoactive substance use, unspecified with psychoactive substance-induced anxiety disorder: Secondary | ICD-10-CM | POA: Diagnosis not present

## 2020-08-17 DIAGNOSIS — F1120 Opioid dependence, uncomplicated: Secondary | ICD-10-CM | POA: Diagnosis not present

## 2020-08-17 DIAGNOSIS — Z79899 Other long term (current) drug therapy: Secondary | ICD-10-CM | POA: Diagnosis not present

## 2020-08-18 DIAGNOSIS — R5383 Other fatigue: Secondary | ICD-10-CM | POA: Diagnosis not present

## 2020-08-18 DIAGNOSIS — M6208 Separation of muscle (nontraumatic), other site: Secondary | ICD-10-CM | POA: Diagnosis not present

## 2020-08-26 DIAGNOSIS — K449 Diaphragmatic hernia without obstruction or gangrene: Secondary | ICD-10-CM | POA: Diagnosis not present

## 2020-08-26 DIAGNOSIS — R634 Abnormal weight loss: Secondary | ICD-10-CM | POA: Diagnosis not present

## 2020-08-26 DIAGNOSIS — Z9889 Other specified postprocedural states: Secondary | ICD-10-CM | POA: Diagnosis not present

## 2020-08-26 DIAGNOSIS — E1165 Type 2 diabetes mellitus with hyperglycemia: Secondary | ICD-10-CM | POA: Diagnosis not present

## 2020-08-26 DIAGNOSIS — Z609 Problem related to social environment, unspecified: Secondary | ICD-10-CM | POA: Diagnosis not present

## 2020-08-26 DIAGNOSIS — Z794 Long term (current) use of insulin: Secondary | ICD-10-CM | POA: Diagnosis not present

## 2020-08-26 DIAGNOSIS — Z7984 Long term (current) use of oral hypoglycemic drugs: Secondary | ICD-10-CM | POA: Diagnosis not present

## 2020-08-26 DIAGNOSIS — R1310 Dysphagia, unspecified: Secondary | ICD-10-CM | POA: Diagnosis not present

## 2020-08-26 DIAGNOSIS — K469 Unspecified abdominal hernia without obstruction or gangrene: Secondary | ICD-10-CM | POA: Diagnosis not present

## 2020-08-28 DIAGNOSIS — S060X0A Concussion without loss of consciousness, initial encounter: Secondary | ICD-10-CM | POA: Diagnosis not present

## 2020-08-31 DIAGNOSIS — F1120 Opioid dependence, uncomplicated: Secondary | ICD-10-CM | POA: Diagnosis not present

## 2020-08-31 DIAGNOSIS — F319 Bipolar disorder, unspecified: Secondary | ICD-10-CM | POA: Diagnosis not present

## 2020-08-31 DIAGNOSIS — Z79899 Other long term (current) drug therapy: Secondary | ICD-10-CM | POA: Diagnosis not present

## 2020-08-31 DIAGNOSIS — F19980 Other psychoactive substance use, unspecified with psychoactive substance-induced anxiety disorder: Secondary | ICD-10-CM | POA: Diagnosis not present

## 2020-08-31 DIAGNOSIS — Z6825 Body mass index (BMI) 25.0-25.9, adult: Secondary | ICD-10-CM | POA: Diagnosis not present

## 2020-08-31 DIAGNOSIS — Z79891 Long term (current) use of opiate analgesic: Secondary | ICD-10-CM | POA: Diagnosis not present

## 2020-09-02 ENCOUNTER — Encounter: Payer: Self-pay | Admitting: Gastroenterology

## 2020-09-02 DIAGNOSIS — F3341 Major depressive disorder, recurrent, in partial remission: Secondary | ICD-10-CM | POA: Diagnosis not present

## 2020-09-02 DIAGNOSIS — R634 Abnormal weight loss: Secondary | ICD-10-CM | POA: Diagnosis not present

## 2020-09-02 DIAGNOSIS — M797 Fibromyalgia: Secondary | ICD-10-CM | POA: Diagnosis not present

## 2020-09-02 DIAGNOSIS — E1140 Type 2 diabetes mellitus with diabetic neuropathy, unspecified: Secondary | ICD-10-CM | POA: Diagnosis not present

## 2020-09-02 DIAGNOSIS — E785 Hyperlipidemia, unspecified: Secondary | ICD-10-CM | POA: Diagnosis not present

## 2020-09-02 DIAGNOSIS — E1169 Type 2 diabetes mellitus with other specified complication: Secondary | ICD-10-CM | POA: Diagnosis not present

## 2020-09-02 DIAGNOSIS — K439 Ventral hernia without obstruction or gangrene: Secondary | ICD-10-CM | POA: Diagnosis not present

## 2020-09-02 DIAGNOSIS — K581 Irritable bowel syndrome with constipation: Secondary | ICD-10-CM | POA: Diagnosis not present

## 2020-09-02 DIAGNOSIS — F411 Generalized anxiety disorder: Secondary | ICD-10-CM | POA: Diagnosis not present

## 2020-09-02 DIAGNOSIS — M150 Primary generalized (osteo)arthritis: Secondary | ICD-10-CM | POA: Diagnosis not present

## 2020-09-02 DIAGNOSIS — G894 Chronic pain syndrome: Secondary | ICD-10-CM | POA: Diagnosis not present

## 2020-09-02 DIAGNOSIS — R1310 Dysphagia, unspecified: Secondary | ICD-10-CM | POA: Diagnosis not present

## 2020-09-02 DIAGNOSIS — Z7984 Long term (current) use of oral hypoglycemic drugs: Secondary | ICD-10-CM | POA: Diagnosis not present

## 2020-09-02 DIAGNOSIS — K222 Esophageal obstruction: Secondary | ICD-10-CM | POA: Diagnosis not present

## 2020-09-02 DIAGNOSIS — Z794 Long term (current) use of insulin: Secondary | ICD-10-CM | POA: Diagnosis not present

## 2020-09-03 ENCOUNTER — Other Ambulatory Visit: Payer: Self-pay | Admitting: Nurse Practitioner

## 2020-09-03 DIAGNOSIS — Z1231 Encounter for screening mammogram for malignant neoplasm of breast: Secondary | ICD-10-CM

## 2020-09-07 ENCOUNTER — Other Ambulatory Visit: Payer: Self-pay | Admitting: Family Medicine

## 2020-09-07 ENCOUNTER — Other Ambulatory Visit: Payer: Self-pay | Admitting: Nurse Practitioner

## 2020-09-07 DIAGNOSIS — Z1231 Encounter for screening mammogram for malignant neoplasm of breast: Secondary | ICD-10-CM

## 2020-09-07 DIAGNOSIS — Z13820 Encounter for screening for osteoporosis: Secondary | ICD-10-CM

## 2020-09-14 DIAGNOSIS — Z6825 Body mass index (BMI) 25.0-25.9, adult: Secondary | ICD-10-CM | POA: Diagnosis not present

## 2020-09-14 DIAGNOSIS — F19980 Other psychoactive substance use, unspecified with psychoactive substance-induced anxiety disorder: Secondary | ICD-10-CM | POA: Diagnosis not present

## 2020-09-14 DIAGNOSIS — Z79899 Other long term (current) drug therapy: Secondary | ICD-10-CM | POA: Diagnosis not present

## 2020-09-14 DIAGNOSIS — F1120 Opioid dependence, uncomplicated: Secondary | ICD-10-CM | POA: Diagnosis not present

## 2020-09-14 DIAGNOSIS — F319 Bipolar disorder, unspecified: Secondary | ICD-10-CM | POA: Diagnosis not present

## 2020-09-28 ENCOUNTER — Ambulatory Visit: Payer: Medicare HMO | Admitting: Gastroenterology

## 2020-09-28 DIAGNOSIS — Z79899 Other long term (current) drug therapy: Secondary | ICD-10-CM | POA: Diagnosis not present

## 2020-09-28 DIAGNOSIS — Z79891 Long term (current) use of opiate analgesic: Secondary | ICD-10-CM | POA: Diagnosis not present

## 2020-09-28 DIAGNOSIS — Z6824 Body mass index (BMI) 24.0-24.9, adult: Secondary | ICD-10-CM | POA: Diagnosis not present

## 2020-09-28 DIAGNOSIS — F319 Bipolar disorder, unspecified: Secondary | ICD-10-CM | POA: Diagnosis not present

## 2020-09-28 DIAGNOSIS — F1120 Opioid dependence, uncomplicated: Secondary | ICD-10-CM | POA: Diagnosis not present

## 2020-09-28 DIAGNOSIS — F19980 Other psychoactive substance use, unspecified with psychoactive substance-induced anxiety disorder: Secondary | ICD-10-CM | POA: Diagnosis not present

## 2020-09-28 NOTE — Progress Notes (Deleted)
[facility] Consult Note:  History: Cassie Cuevas 09/28/2020  Referring provider: Mack Hook, MD [facility]")  Reason for consult/chief complaint: No chief complaint on file.   Subjective  HPI:  ***  ____________________ She has previously been evaluated by a [facility] practice in [place], most recently November 2019, excerpt of that note as follows: "Cassie Cuevas is a 65 y.o. White or Caucasian female with a significant PMHx including chronic pain syndrome, anxiety, and PTSD who presents today with her husband for epigastric pain, nausea, and constipation. Her chart reviewed and followed by Alger Memos, MD ; last seen in 11/2017 by undersigned for an EGD callback due to Barrett's. She also requested a referral to general surgery regarding pain at the site of her previous ventral hernia repair. She saw Dr. Christian Mate and he offered reassurance that her hernia hasn't recurred.   She is here today with continued epigastric pain and pain around the site of her previous hernia repair. She also has nausea every time she eats. No vomiting. She takes Nexium as needed for heartburn. Her constipation was fairly well controlled with Linzess 290 mcg, but she is on disability and can't afford to pay $60 for it. She hasn't tried Miralax in years. No hematochezia. Last colonoscopy in 09/2014 with prominent internal hemorrhoids. Due for a colonoscopy in 09/2024.   Date of last EGD: January 2018 Significant findings: Normal; gastrix bx showed chronic gastritis. Esophageal biopsies weren't performed since it was just for epigastric pain.   Date of last EGD with esophageal biopsies: June 2016 Significant Findings: Barrett's esophagus without dysplasia  CTAP w/ contrast 11/16/17- 1. Diffuse fatty infiltration of the liver. 2. Bladder wall thickening may indicate cystitis or under Distention. 3. Postoperative scarring in the anterior abdominal wall.  No evidence of residual or recurrent ventral hernia."  _______________________ Patient was advised to take MiraLAX and an upper endoscopy was scheduled, no report available. _______________________________________ Office note by Dr. Zeb Comfort (general surgery) in [place] on 07/14/2019 describes patient's ongoing abdominal pain and constipation, and they resumed Linzess. CT scan reportedly showed rectus diastasis without hernia.  ROS:  Review of Systems   Past Medical History: Past Medical History:  Diagnosis Date  . Anxiety   . Barrett's esophagus   . Bipolar 1 disorder ([place])   . Chronic GERD   . Depression   . Diabetes mellitus without complication ([place])   . Fibromyalgia   . Hypercholesterolemia   . Hypertension   . IBS (irritable bowel syndrome)   . IBS (irritable bowel syndrome)   . Osteoporosis      Past Surgical History: Past Surgical History:  Procedure Laterality Date  . CHOLECYSTECTOMY    . HERNIA REPAIR  X2  . TOTAL ABDOMINAL HYSTERECTOMY       Family History: Family History  Problem Relation Age of Onset  . Heart Problems Mother 49       CABG    Social History: Social History   Socioeconomic History  . Marital status: Married    Spouse name: Not on file  . Number of children: Not on file  . Years of education: Not on file  . Highest education level: Not on file  Occupational History  . Not on file  Tobacco Use  . Smoking status: Current Every Day Smoker    Packs/day: 1.00    Types: Cigarettes  . Smokeless tobacco: Never Used  Substance and Sexual Activity  . Alcohol use: No  .  Drug use: No  . Sexual activity: Not on file  Other Topics Concern  . Not on file  Social History Narrative  . Not on file   Social Determinants of Health   Financial Resource Strain: Not on file  Food Insecurity: Not on file  Transportation Needs: Not on file  Physical Activity: Not on file  Stress: Not on file  Social Connections: Not on file     Allergies: Allergies  Allergen Reactions  . Latex Dermatitis  . Penicillins Nausea And Vomiting, Rash and Other (See Comments)    Has patient had a PCN reaction causing immediate rash, facial/tongue/throat swelling, SOB or lightheadedness with hypotension: yes Has patient had a PCN reaction causing severe rash involving mucus membranes or skin necrosis: no Has patient had a PCN reaction that required hospitalization: no Has patient had a PCN reaction occurring within the last 10 years: yes If all of the above answers are "NO", then may proceed with Cephalosporin use.    . Tape Rash and Other (See Comments)    needs paper tape     Outpatient Meds: Current Outpatient Medications  Medication Sig Dispense Refill  . diazepam (VALIUM) 10 MG tablet Take 5-10 mg by mouth 2 (two) times daily as needed for anxiety.     . dicyclomine (BENTYL) 20 MG tablet Take 1 tablet (20 mg total) by mouth 2 (two) times daily. (Patient not taking: Reported on 10/23/2017) 20 tablet 0  . esomeprazole (NEXIUM) 40 MG capsule Take 40 mg by mouth daily.     . fluticasone (FLONASE) 50 MCG/ACT nasal spray Place 2 sprays into both nostrils daily as needed for allergies. For allergies     . gabapentin (NEURONTIN) 800 MG tablet Take 800-1,600 tablets by mouth See admin instructions. Take 1 tablet by mouth 3 times daily before meals & take 2 tablets by mouth at bedtime    . glimepiride (AMARYL) 4 MG tablet Take 4 mg by mouth 2 (two) times daily.  5  . lamoTRIgine (LAMICTAL) 25 MG tablet Take 25 mg by mouth 3 (three) times daily.  0  . metFORMIN (GLUCOPHAGE) 500 MG tablet Take 1,000-1,500 tablets See admin instructions by mouth. Take 3 tablets by mouth each morning and 2 tablets by mouth each evening.    . ondansetron (ZOFRAN ODT) 4 MG disintegrating tablet Take 1 tablet (4 mg total) by mouth every 8 (eight) hours as needed for nausea or vomiting. 15 tablet 0  . PARoxetine (PAXIL) 30 MG tablet Take 60 mg by mouth at  bedtime.     . polyethylene glycol (MIRALAX / GLYCOLAX) packet Take 17 g by mouth daily. Take daily until you achieve soft daily bowel movements, then titrate dosing to continue having daily soft bowel movements 30 each 0  . ZUBSOLV 5.7-1.4 MG SUBL Place 2 tablets under the tongue daily.     No current facility-administered medications for this visit.      ___________________________________________________________________ Objective   Exam:  There were no vitals taken for this visit. Wt Readings from Last 3 Encounters:  06/21/18 148 lb (67.1 kg)  10/23/17 144 lb 13.5 oz (65.7 kg)  02/25/17 125 lb 14.1 oz (57.1 kg)     General: ***   Eyes: sclera anicteric, no redness  ENT: oral mucosa moist without lesions, no cervical or supraclavicular lymphadenopathy  CV: RRR without murmur, S1/S2, no JVD, no peripheral edema  Resp: clear to auscultation bilaterally, normal RR and effort noted  GI: soft, *** tenderness, with active  bowel sounds. No guarding or palpable organomegaly noted.  Skin; warm and dry, no rash or jaundice noted  Neuro: awake, alert and oriented x 3. Normal gross motor function and fluent speech  Labs:  ***  Radiologic Studies:  ***  Assessment: No diagnosis found.  ***  Plan:  ***  Thank you for the courtesy of this consult.  Please call me with any questions or concerns.  Nelida Meuse III  CC: Referring provider noted above

## 2020-09-30 DIAGNOSIS — Z794 Long term (current) use of insulin: Secondary | ICD-10-CM | POA: Diagnosis not present

## 2020-09-30 DIAGNOSIS — Z7984 Long term (current) use of oral hypoglycemic drugs: Secondary | ICD-10-CM | POA: Diagnosis not present

## 2020-09-30 DIAGNOSIS — E1140 Type 2 diabetes mellitus with diabetic neuropathy, unspecified: Secondary | ICD-10-CM | POA: Diagnosis not present

## 2020-09-30 DIAGNOSIS — K469 Unspecified abdominal hernia without obstruction or gangrene: Secondary | ICD-10-CM | POA: Diagnosis not present

## 2020-10-06 DIAGNOSIS — E118 Type 2 diabetes mellitus with unspecified complications: Secondary | ICD-10-CM | POA: Diagnosis not present

## 2020-10-06 DIAGNOSIS — Z794 Long term (current) use of insulin: Secondary | ICD-10-CM | POA: Diagnosis not present

## 2020-10-12 DIAGNOSIS — F1120 Opioid dependence, uncomplicated: Secondary | ICD-10-CM | POA: Diagnosis not present

## 2020-10-12 DIAGNOSIS — F319 Bipolar disorder, unspecified: Secondary | ICD-10-CM | POA: Diagnosis not present

## 2020-10-12 DIAGNOSIS — F19980 Other psychoactive substance use, unspecified with psychoactive substance-induced anxiety disorder: Secondary | ICD-10-CM | POA: Diagnosis not present

## 2020-10-12 DIAGNOSIS — Z6823 Body mass index (BMI) 23.0-23.9, adult: Secondary | ICD-10-CM | POA: Diagnosis not present

## 2020-10-12 DIAGNOSIS — Z79899 Other long term (current) drug therapy: Secondary | ICD-10-CM | POA: Diagnosis not present

## 2020-10-21 DIAGNOSIS — Z6823 Body mass index (BMI) 23.0-23.9, adult: Secondary | ICD-10-CM | POA: Diagnosis not present

## 2020-10-21 DIAGNOSIS — F19980 Other psychoactive substance use, unspecified with psychoactive substance-induced anxiety disorder: Secondary | ICD-10-CM | POA: Diagnosis not present

## 2020-10-21 DIAGNOSIS — Z79899 Other long term (current) drug therapy: Secondary | ICD-10-CM | POA: Diagnosis not present

## 2020-10-21 DIAGNOSIS — F1120 Opioid dependence, uncomplicated: Secondary | ICD-10-CM | POA: Diagnosis not present

## 2020-10-21 DIAGNOSIS — Z79891 Long term (current) use of opiate analgesic: Secondary | ICD-10-CM | POA: Diagnosis not present

## 2020-10-21 DIAGNOSIS — F319 Bipolar disorder, unspecified: Secondary | ICD-10-CM | POA: Diagnosis not present

## 2020-11-05 DIAGNOSIS — Z794 Long term (current) use of insulin: Secondary | ICD-10-CM | POA: Diagnosis not present

## 2020-11-05 DIAGNOSIS — Z79891 Long term (current) use of opiate analgesic: Secondary | ICD-10-CM | POA: Diagnosis not present

## 2020-11-05 DIAGNOSIS — F1120 Opioid dependence, uncomplicated: Secondary | ICD-10-CM | POA: Diagnosis not present

## 2020-11-05 DIAGNOSIS — F319 Bipolar disorder, unspecified: Secondary | ICD-10-CM | POA: Diagnosis not present

## 2020-11-05 DIAGNOSIS — F19980 Other psychoactive substance use, unspecified with psychoactive substance-induced anxiety disorder: Secondary | ICD-10-CM | POA: Diagnosis not present

## 2020-11-05 DIAGNOSIS — E118 Type 2 diabetes mellitus with unspecified complications: Secondary | ICD-10-CM | POA: Diagnosis not present

## 2020-11-05 DIAGNOSIS — Z6824 Body mass index (BMI) 24.0-24.9, adult: Secondary | ICD-10-CM | POA: Diagnosis not present

## 2020-11-05 DIAGNOSIS — Z79899 Other long term (current) drug therapy: Secondary | ICD-10-CM | POA: Diagnosis not present

## 2020-11-09 DIAGNOSIS — M797 Fibromyalgia: Secondary | ICD-10-CM | POA: Diagnosis not present

## 2020-11-09 DIAGNOSIS — F419 Anxiety disorder, unspecified: Secondary | ICD-10-CM | POA: Diagnosis not present

## 2020-11-09 DIAGNOSIS — E785 Hyperlipidemia, unspecified: Secondary | ICD-10-CM | POA: Diagnosis not present

## 2020-11-09 DIAGNOSIS — Z6824 Body mass index (BMI) 24.0-24.9, adult: Secondary | ICD-10-CM | POA: Diagnosis not present

## 2020-11-09 DIAGNOSIS — E1169 Type 2 diabetes mellitus with other specified complication: Secondary | ICD-10-CM | POA: Diagnosis not present

## 2020-11-09 DIAGNOSIS — Z87891 Personal history of nicotine dependence: Secondary | ICD-10-CM | POA: Diagnosis not present

## 2020-11-09 DIAGNOSIS — F32A Depression, unspecified: Secondary | ICD-10-CM | POA: Diagnosis not present

## 2020-11-09 DIAGNOSIS — E781 Pure hyperglyceridemia: Secondary | ICD-10-CM | POA: Diagnosis not present

## 2020-11-18 DIAGNOSIS — E1140 Type 2 diabetes mellitus with diabetic neuropathy, unspecified: Secondary | ICD-10-CM | POA: Diagnosis not present

## 2020-11-18 DIAGNOSIS — I10 Essential (primary) hypertension: Secondary | ICD-10-CM | POA: Diagnosis not present

## 2020-11-18 DIAGNOSIS — M799 Soft tissue disorder, unspecified: Secondary | ICD-10-CM | POA: Diagnosis not present

## 2020-11-18 DIAGNOSIS — N390 Urinary tract infection, site not specified: Secondary | ICD-10-CM | POA: Diagnosis not present

## 2020-11-18 DIAGNOSIS — R300 Dysuria: Secondary | ICD-10-CM | POA: Diagnosis not present

## 2020-11-18 DIAGNOSIS — M797 Fibromyalgia: Secondary | ICD-10-CM | POA: Diagnosis not present

## 2020-11-18 DIAGNOSIS — R634 Abnormal weight loss: Secondary | ICD-10-CM | POA: Diagnosis not present

## 2020-11-18 DIAGNOSIS — E785 Hyperlipidemia, unspecified: Secondary | ICD-10-CM | POA: Diagnosis not present

## 2020-11-18 DIAGNOSIS — Z20822 Contact with and (suspected) exposure to covid-19: Secondary | ICD-10-CM | POA: Diagnosis not present

## 2020-11-18 DIAGNOSIS — K581 Irritable bowel syndrome with constipation: Secondary | ICD-10-CM | POA: Diagnosis not present

## 2020-11-18 DIAGNOSIS — E1165 Type 2 diabetes mellitus with hyperglycemia: Secondary | ICD-10-CM | POA: Diagnosis not present

## 2020-11-19 DIAGNOSIS — F319 Bipolar disorder, unspecified: Secondary | ICD-10-CM | POA: Diagnosis not present

## 2020-11-19 DIAGNOSIS — Z79891 Long term (current) use of opiate analgesic: Secondary | ICD-10-CM | POA: Diagnosis not present

## 2020-11-19 DIAGNOSIS — Z6824 Body mass index (BMI) 24.0-24.9, adult: Secondary | ICD-10-CM | POA: Diagnosis not present

## 2020-11-19 DIAGNOSIS — F19980 Other psychoactive substance use, unspecified with psychoactive substance-induced anxiety disorder: Secondary | ICD-10-CM | POA: Diagnosis not present

## 2020-11-19 DIAGNOSIS — F1120 Opioid dependence, uncomplicated: Secondary | ICD-10-CM | POA: Diagnosis not present

## 2020-11-19 DIAGNOSIS — Z79899 Other long term (current) drug therapy: Secondary | ICD-10-CM | POA: Diagnosis not present

## 2020-11-24 ENCOUNTER — Encounter: Payer: Self-pay | Admitting: Gastroenterology

## 2020-11-24 ENCOUNTER — Ambulatory Visit: Payer: Medicare HMO | Admitting: Gastroenterology

## 2020-11-24 ENCOUNTER — Telehealth: Payer: Self-pay

## 2020-11-24 VITALS — BP 98/62 | HR 96 | Ht 63.0 in | Wt 142.0 lb

## 2020-11-24 DIAGNOSIS — R1319 Other dysphagia: Secondary | ICD-10-CM | POA: Diagnosis not present

## 2020-11-24 DIAGNOSIS — K5909 Other constipation: Secondary | ICD-10-CM

## 2020-11-24 DIAGNOSIS — K2270 Barrett's esophagus without dysplasia: Secondary | ICD-10-CM | POA: Diagnosis not present

## 2020-11-24 DIAGNOSIS — R1084 Generalized abdominal pain: Secondary | ICD-10-CM

## 2020-11-24 MED ORDER — NALOXEGOL OXALATE 12.5 MG PO TABS: 12.5000 mg | ORAL_TABLET | Freq: Every day | ORAL | 0 refills | Status: DC

## 2020-11-24 MED ORDER — GOLYTELY 236 G PO SOLR: 4000.0000 mL | Freq: Once | ORAL | 0 refills | Status: AC

## 2020-11-24 MED ORDER — NA SULFATE-K SULFATE-MG SULF 17.5-3.13-1.6 GM/177ML PO SOLN: 1.0000 | Freq: Once | ORAL | 0 refills | Status: AC

## 2020-11-24 MED ORDER — NALOXEGOL OXALATE 25 MG PO TABS: 25.0000 mg | ORAL_TABLET | Freq: Every day | ORAL | 0 refills | Status: DC

## 2020-11-24 NOTE — Progress Notes (Signed)
[facility] Consult Note:  History: Cassie Cuevas 11/24/2020  Referring provider: Zeb Comfort, MD [facility])  Reason for consult/chief complaint: Dysphagia (Onset over a yr, food and pills and liquid, EGD ), Abdominal Pain (Generalized for over a yr), Rectal Bleeding (Comes and goes, always BR on the tissue), Hemorrhoids, and Constipation (Chronic issue Castor oil, Mirlax does not work for her)   Subjective  HPI:  This is a 65 year old woman referred by her surgeon in [place] for another opinion on chronic digestive issues.  Of note, did not show to an appointment in June of this year and was late for today's appointment. Both my staff and I had a difficult time getting a clear and consistent history from this patient.  She is rather tangential historian with multiple complaints and is fixated on her protuberant abdominal wall that has been the case since a hernia repair in 2016.  Limited records available in care everywhere show that she has been seen by GI group in [facility] in 2018 and 2019.  Those notes indicate a diagnosis of Barrett's esophagus without dysplasia in 2016.  Patient had another endoscopy in 2018 for abdominal pain no esophageal biopsies taken.  Last note from 2019 indicates plans for an upper endoscopy, though patient says that procedure was not done and no report is found.  She has generalized chronic unrelenting abdominal discomfort and protuberance.  She showed me how it protrudes with changes in position, she wants my opinion on why it happened after surgery and what can be done about it.  She also wants "a total evaluation" of her digestive issues.  She does not feel anybody had previously given her an answer, and she has chronic dysphagia as well as constipation.  Food and liquids feel stuck in the chest sometimes and she has a discomfort in the throat as well.  She has severe constipation, sometimes going a week or more without  a BM, and may have to disimpact herself, all of which has really not improved much at all on either Linzess or MiraLAX.  She does not recall if any other medicines were tried in the past.  Cassie Cuevas has also been on opioids for fibromyalgia for years.  She denies nausea vomiting loss of appetite or weight loss. Prior GI and surgical notes indicate the patient has had a least 1 CT abdomen and pelvis in recent years as work-up for the symptoms. Lastly, she says that in the past she was diagnosed with ulcerative colitis as was her sister, and that her case "cleared up".  There are no records about this ROS:  Review of Systems  Constitutional:  Negative for appetite change and unexpected weight change.  HENT:  Negative for mouth sores and voice change.   Eyes:  Negative for pain and redness.  Respiratory:  Negative for cough and shortness of breath.   Cardiovascular:  Negative for chest pain and palpitations.  Genitourinary:  Negative for dysuria and hematuria.  Musculoskeletal:  Negative for arthralgias and myalgias.       Chronic diffuse pain from fibromyalgia  Skin:  Negative for pallor and rash.  Neurological:  Negative for weakness and headaches.  Hematological:  Negative for adenopathy.  Psychiatric/Behavioral:         Anxiety and depression    Past Medical History: Past Medical History:  Diagnosis Date   Anxiety    Barrett's esophagus    Bipolar 1 disorder (HCC)    Chronic GERD  Depression    Diabetes mellitus without complication (HCC)    Fibromyalgia    Hypercholesterolemia    Hypertension    IBS (irritable bowel syndrome)    IBS (irritable bowel syndrome)    Osteoporosis      Past Surgical History: Past Surgical History:  Procedure Laterality Date   CHOLECYSTECTOMY     HERNIA REPAIR  X2   TOTAL ABDOMINAL HYSTERECTOMY       Family History: Family History  Problem Relation Age of Onset   Heart Problems Mother 11       CABG   Colon cancer Neg Hx    Esophageal  cancer Neg Hx     Social History: Social History   Socioeconomic History   Marital status: Married    Spouse name: Not on file   Number of children: Not on file   Years of education: Not on file   Highest education level: Not on file  Occupational History   Not on file  Tobacco Use   Smoking status: Every Day    Packs/day: 1.00    Types: Cigarettes   Smokeless tobacco: Never  Vaping Use   Vaping Use: Never used  Substance and Sexual Activity   Alcohol use: No   Drug use: No   Sexual activity: Not on file  Other Topics Concern   Not on file  Social History Narrative   Not on file   Social Determinants of Health   Financial Resource Strain: Not on file  Food Insecurity: Not on file  Transportation Needs: Not on file  Physical Activity: Not on file  Stress: Not on file  Social Connections: Not on file    Allergies: Allergies  Allergen Reactions   Latex Dermatitis   Penicillins Nausea And Vomiting, Rash and Other (See Comments)    Has patient had a PCN reaction causing immediate rash, facial/tongue/throat swelling, SOB or lightheadedness with hypotension: yes Has patient had a PCN reaction causing severe rash involving mucus membranes or skin necrosis: no Has patient had a PCN reaction that required hospitalization: no Has patient had a PCN reaction occurring within the last 10 years: yes If all of the above answers are "NO", then may proceed with Cephalosporin use.     Tape Rash and Other (See Comments)    needs paper tape     Outpatient Meds: Current Outpatient Medications  Medication Sig Dispense Refill   Buprenorphine HCl-Naloxone HCl 8-2 MG FILM Place 2 Film under the tongue daily.     Cholecalciferol (VITAMIN D) 50 MCG (2000 UT) CAPS Take 5 capsules by mouth daily.     diazepam (VALIUM) 10 MG tablet Take 5-10 mg by mouth 2 (two) times daily as needed for anxiety.      esomeprazole (NEXIUM) 40 MG capsule Take 40 mg by mouth daily.      fenofibrate 160  MG tablet daily.     ferrous sulfate 325 (65 FE) MG tablet Take 325 mg by mouth daily with breakfast.     fluticasone (FLONASE) 50 MCG/ACT nasal spray Place 2 sprays into both nostrils daily as needed for allergies. For allergies      furosemide (LASIX) 40 MG tablet Take 40 mg by mouth as needed.     gabapentin (NEURONTIN) 800 MG tablet Take 800-1,600 tablets by mouth See admin instructions. Take 1 tablet by mouth 3 times daily before meals & take 2 tablets by mouth at bedtime     glimepiride (AMARYL) 4 MG tablet Take 4 mg  by mouth 2 (two) times daily.  5   meloxicam (MOBIC) 15 MG tablet Take by mouth daily.     metFORMIN (GLUCOPHAGE) 500 MG tablet Take 1,000-1,500 tablets See admin instructions by mouth. Take 3 tablets by mouth each morning and 2 tablets by mouth each evening.     Na Sulfate-K Sulfate-Mg Sulf 17.5-3.13-1.6 GM/177ML SOLN Take 1 kit by mouth once for 1 dose. 354 mL 0   naloxegol oxalate (MOVANTIK) 12.5 MG TABS tablet Take 1 tablet (12.5 mg total) by mouth daily. 6 tablet 0   naloxegol oxalate (MOVANTIK) 25 MG TABS tablet Take 1 tablet (25 mg total) by mouth daily. 6 tablet 0   PARoxetine (PAXIL) 30 MG tablet Take 60 mg by mouth at bedtime.      polyethylene glycol (GOLYTELY) 236 g solution Take 4,000 mLs by mouth once for 1 dose. 4000 mL 0   rosuvastatin (CRESTOR) 5 MG tablet      vitamin C (ASCORBIC ACID) 500 MG tablet Take 500 mg by mouth daily.     dicyclomine (BENTYL) 20 MG tablet Take 1 tablet (20 mg total) by mouth 2 (two) times daily. (Patient not taking: No sig reported) 20 tablet 0   lamoTRIgine (LAMICTAL) 25 MG tablet Take 25 mg by mouth 3 (three) times daily. (Patient not taking: Reported on 11/24/2020)  0   ondansetron (ZOFRAN ODT) 4 MG disintegrating tablet Take 1 tablet (4 mg total) by mouth every 8 (eight) hours as needed for nausea or vomiting. (Patient not taking: Reported on 11/24/2020) 15 tablet 0   No current facility-administered medications for this visit.       ___________________________________________________________________ Objective   Exam:  BP 98/62   Pulse 96   Ht _0  (1.6 m)   Wt 142 lb (64.4 kg)   SpO2 96%   BMI 25.15 kg/m  Wt Readings from Last 3 Encounters:  11/24/20 142 lb (64.4 kg)  06/21/18 148 lb (67.1 kg)  10/23/17 144 lb 13.5 oz (65.7 kg)   Carlos husband was present for the entire visit. General: Ambulatory, gets on exam table without assistance.  Needs help sitting up because she has weakened abdominal wall musculature.  She has protuberant abdomen with a rectus diastasis and generalized loss of anterior abdominal wall muscle tone. Eyes: sclera anicteric, no redness ENT: oral mucosa moist without lesions, no cervical or supraclavicular lymphadenopathy CV: RRR without murmur, S1/S2, no JVD, no peripheral edema Resp: clear to auscultation bilaterally, normal RR and effort noted GI: soft, generalized abdominal wall tenderness to light palpation, with active bowel sounds. No guarding or palpable organomegaly noted. Skin; warm and dry, no rash or jaundice noted Neuro: awake, alert and oriented x 3. Normal gross motor function and fluent speech Antalgic gait Labs:  No recent data for review  Endoscopic procedures and imaging as noted above  Assessment: Encounter Diagnoses  Name Primary?   Esophageal dysphagia Yes   Generalized abdominal pain    Chronic constipation    Barrett's esophagus without dysplasia     Multiple chronic GI symptoms with prior evaluation.  She had told me no endoscopic procedures were done since her surgery, but I pointed out that documentation from GI in [facility] shows otherwise.  She reports ongoing dysphagia and has a reported history of Barrett's.  Not clear if this is mechanical such as stricture or dysmotility from chronic opioids. Generalized abdominal pain in the setting of chronic pain syndrome and loss of abdominal wall musculature and a rectus diastasis  from prior  surgery.  She was disappointed that I could not explain why she had that phenomenon develop after surgery and also that it was not something I could provide relief for her.  She may want to consider an abdominal binder to give support to the abdominal wall.  Chronic constipation, sounds longstanding and believed to be functional in nature from previous evaluations.  Almost certainly some element of OIC as well.  Plan:  Upper endoscopy with possible dilation and diagnostic colonoscopy for chronic constipation and abdominal pain.  She believes she had a colonoscopy many years ago, there is no reference to it neither the GI or general surgery notes I was able to see.  If it was done, it sounds like it was prior to her 2016 abdominal wall surgery with mesh repair.  The benefits and risks of the planned procedure were described in detail with the patient or (when appropriate) their health care proxy.  Risks were outlined as including, but not limited to, bleeding, infection, perforation, adverse medication reaction leading to cardiac or pulmonary decompensation, pancreatitis (if ERCP).  The limitation of incomplete mucosal visualization was also discussed.  No guarantees or warranties were given.  She needs a full 2-day preparation with Suprep followed by 4 L split dose PEG preparation given her reported severe constipation.  We gave her samples of Movantik to try 12.5 mg daily at first, then increase to samples of 25 mg once daily if insufficient effect at lower dose.  Total 55-minute time required  Nelida Meuse III  CC: Referring provider noted above

## 2020-11-24 NOTE — Telephone Encounter (Signed)
Pt is diabetic and mentioned she is on Levimir. This was not brought up during her visit or medication review.   Pt will hold her DM meds as directed at her visit per Dr Loletha Carrow

## 2020-11-24 NOTE — Patient Instructions (Addendum)
If you are age 63 or older, your body mass index should be between 23-30. Your Body mass index is 25.15 kg/m. If this is out of the aforementioned range listed, please consider follow up with your Primary Care Provider.  If you are age 75 or younger, your body mass index should be between 19-25. Your Body mass index is 25.15 kg/m. If this is out of the aformentioned range listed, please consider follow up with your Primary Care Provider.   __________________________________________________________  The [facility] providers would like to encourage you to use [facility] to communicate with providers for non-urgent requests or questions.  Due to long hold times on the telephone, sending your provider a message by [facility] may be a faster and more efficient way to get a response.  Please allow 48 business hours for a response.  Please remember that this is for non-urgent requests.   You have been scheduled for an endoscopy and colonoscopy. Please follow the written instructions given to you at your visit today. Please pick up your prep supplies at the pharmacy within the next 1-3 days. If you use inhalers (even only as needed), please bring them with you on the day of your procedure.   Start the Movantik 12.'5mg'$  tablets once a day in the morning. If after the samples are not working increase to the [employer] '25mg'$  once a day.  It was a pleasure to see you today!  Thank you for trusting me with your gastrointestinal care!

## 2020-11-25 ENCOUNTER — Telehealth: Payer: Self-pay | Admitting: Gastroenterology

## 2020-11-25 NOTE — Telephone Encounter (Signed)
Noted, thank you.  - HD

## 2020-11-25 NOTE — Telephone Encounter (Signed)
Hi Dr. Loletha Carrow, this patient just called to cancel procedure that was scheduled on Monday 11/29/20 because she her insurance [employer] that provides transportation services cannot bring her the day of the procedure. Patient has rescheduled to 01/25/21. Thank you.

## 2020-11-29 ENCOUNTER — Encounter: Payer: Medicare HMO | Admitting: Gastroenterology

## 2020-12-30 ENCOUNTER — Telehealth: Payer: Self-pay | Admitting: Gastroenterology

## 2020-12-30 NOTE — Telephone Encounter (Signed)
Pt is requesting new instructions mailed to her home address. She r/s her procedure to October and lost the original instructions that she was given at the office.

## 2020-12-30 NOTE — Telephone Encounter (Signed)
New instructions have been mailed to the patients home address on file

## 2021-01-18 ENCOUNTER — Encounter: Payer: Self-pay | Admitting: Gastroenterology

## 2021-01-25 ENCOUNTER — Ambulatory Visit (AMBULATORY_SURGERY_CENTER): Payer: Medicare HMO | Admitting: Gastroenterology

## 2021-01-25 ENCOUNTER — Other Ambulatory Visit: Payer: Self-pay

## 2021-01-25 ENCOUNTER — Encounter: Payer: Self-pay | Admitting: Gastroenterology

## 2021-01-25 VITALS — BP 162/86 | HR 74 | Temp 97.5°F | Resp 12 | Ht 63.0 in | Wt 142.0 lb

## 2021-01-25 DIAGNOSIS — K259 Gastric ulcer, unspecified as acute or chronic, without hemorrhage or perforation: Secondary | ICD-10-CM | POA: Diagnosis not present

## 2021-01-25 DIAGNOSIS — R1310 Dysphagia, unspecified: Secondary | ICD-10-CM | POA: Diagnosis not present

## 2021-01-25 DIAGNOSIS — K2270 Barrett's esophagus without dysplasia: Secondary | ICD-10-CM

## 2021-01-25 DIAGNOSIS — K3189 Other diseases of stomach and duodenum: Secondary | ICD-10-CM

## 2021-01-25 DIAGNOSIS — K648 Other hemorrhoids: Secondary | ICD-10-CM

## 2021-01-25 DIAGNOSIS — D123 Benign neoplasm of transverse colon: Secondary | ICD-10-CM

## 2021-01-25 DIAGNOSIS — R1319 Other dysphagia: Secondary | ICD-10-CM

## 2021-01-25 DIAGNOSIS — K5909 Other constipation: Secondary | ICD-10-CM

## 2021-01-25 DIAGNOSIS — R1084 Generalized abdominal pain: Secondary | ICD-10-CM | POA: Diagnosis not present

## 2021-01-25 DIAGNOSIS — D122 Benign neoplasm of ascending colon: Secondary | ICD-10-CM

## 2021-01-25 MED ORDER — SODIUM CHLORIDE 0.9 % IV SOLN: 500.0000 mL | Freq: Once | INTRAVENOUS | Status: AC

## 2021-01-25 NOTE — Patient Instructions (Signed)
Handouts Provided:  Polyps  YOU HAD AN ENDOSCOPIC PROCEDURE TODAY AT [facility]:   Refer to the procedure report that was given to you for any specific questions about what was found during the examination.  If the procedure report does not answer your questions, please call your gastroenterologist to clarify.  If you requested that your care partner not be given the details of your procedure findings, then the procedure report has been included in a sealed envelope for you to review at your convenience later.  YOU SHOULD EXPECT: Some feelings of bloating in the abdomen. Passage of more gas than usual.  Walking can help get rid of the air that was put into your GI tract during the procedure and reduce the bloating. If you had a lower endoscopy (such as a colonoscopy or flexible sigmoidoscopy) you may notice spotting of blood in your stool or on the toilet paper. If you underwent a bowel prep for your procedure, you may not have a normal bowel movement for a few days.  Please Note:  You might notice some irritation and congestion in your nose or some drainage.  This is from the oxygen used during your procedure.  There is no need for concern and it should clear up in a day or so.  SYMPTOMS TO REPORT IMMEDIATELY:  Following lower endoscopy (colonoscopy or flexible sigmoidoscopy):  Excessive amounts of blood in the stool  Significant tenderness or worsening of abdominal pains  Swelling of the abdomen that is new, acute  Fever of 100F or higher  Following upper endoscopy (EGD)  Vomiting of blood or coffee ground material  New chest pain or pain under the shoulder blades  Painful or persistently difficult swallowing  New shortness of breath  Fever of 100F or higher  Black, tarry-looking stools  For urgent or emergent issues, a gastroenterologist can be reached at any hour by calling [phone]. Do not use MyChart messaging for urgent concerns.    DIET:  We do recommend  a small meal at first, but then you may proceed to your regular diet.  Drink plenty of fluids but you should avoid alcoholic beverages for 24 hours.  ACTIVITY:  You should plan to take it easy for the rest of today and you should NOT DRIVE or use heavy machinery until tomorrow (because of the sedation medicines used during the test).    FOLLOW UP: Our staff will call the number listed on your records 48-72 hours following your procedure to check on you and address any questions or concerns that you may have regarding the information given to you following your procedure. If we do not reach you, we will leave a message.  We will attempt to reach you two times.  During this call, we will ask if you have developed any symptoms of COVID 19. If you develop any symptoms (ie: fever, flu-like symptoms, shortness of breath, cough etc.) before then, please call [phone].  If you test positive for Covid 19 in the 2 weeks post procedure, please call and report this information to [place].    If any biopsies were taken you will be contacted by phone or by letter within the next 1-3 weeks.  Please call us at [phone] if you have not heard about the biopsies in 3 weeks.    SIGNATURES/CONFIDENTIALITY: You and/or your care partner have signed paperwork which will be entered into your electronic medical record.  These signatures attest to the fact that that the  information above on your After Visit Summary has been reviewed and is understood.  Full responsibility of the confidentiality of this discharge information lies with you and/or your care-partner.

## 2021-01-25 NOTE — Op Note (Signed)
[facility] Patient Name: Cassie Cuevas Procedure Date: 01/25/2021 10:57 AM MRN: [id number] Endoscopist: Mallie Mussel L. Loletha Carrow , MD Age: 65 Referring MD:  Date of Birth: 1955-07-02 Gender: Female Account #: [id number] Procedure:                Colonoscopy Indications:              Generalized abdominal pain, Constipation Medicines:                Monitored Anesthesia Care Procedure:                Pre-Anesthesia Assessment:                           - Prior to the procedure, a History and Physical                            was performed, and patient medications and                            allergies were reviewed. The patient's tolerance of                            previous anesthesia was also reviewed. The risks                            and benefits of the procedure and the sedation                            options and risks were discussed with the patient.                            All questions were answered, and informed consent                            was obtained. Prior Anticoagulants: The patient has                            taken no previous anticoagulant or antiplatelet                            agents. ASA Grade Assessment: II - A patient with                            mild systemic disease. After reviewing the risks                            and benefits, the patient was deemed in                            satisfactory condition to undergo the procedure.                           - Prior to the procedure, a History and Physical  was performed, and patient medications and                            allergies were reviewed. The patient's tolerance of                            previous anesthesia was also reviewed. The risks                            and benefits of the procedure and the sedation                            options and risks were discussed with the patient.                            All questions were  answered, and informed consent                            was obtained. Prior Anticoagulants: The patient has                            taken no previous anticoagulant or antiplatelet                            agents. ASA Grade Assessment: II - A patient with                            mild systemic disease. After reviewing the risks                            and benefits, the patient was deemed in                            satisfactory condition to undergo the procedure.                           After obtaining informed consent, the colonoscope                            was passed under direct vision. Throughout the                            procedure, the patient's blood pressure, pulse, and                            oxygen saturations were monitored continuously. The                            Olympus CF-HQ190L ([id number]) Colonoscope was                            introduced through the anus and advanced to the the  cecum, identified by appendiceal orifice and                            ileocecal valve. The colonoscopy was somewhat                            difficult due to poor bowel prep and a redundant                            colon. Successful completion of the procedure was                            aided by using manual pressure, straightening and                            shortening the scope to obtain bowel loop reduction                            and lavage. The patient tolerated the procedure                            well. The quality of the bowel preparation was                            poor. The ileocecal valve, appendiceal orifice, and                            rectum were photographed. The bowel preparation                            used was 2 day Suprep/Golytely (due to OIC). Scope In: 11:17:31 AM Scope Out: 11:48:12 AM Scope Withdrawal Time: 0 hours 21 minutes 14 seconds  Total Procedure Duration: 0 hours 30 minutes 41  seconds  Findings:                 The perianal and digital rectal examinations were                            normal.                           Four sessile polyps were found in the proximal                            transverse colon and ascending colon. The polyps                            were 3 to 8 mm in size. These polyps were removed                            with a cold snare. Resection and retrieval were                            complete.  A large amount of liquid stool was found in the                            entire colon, interfering with visualization.                            Lavage of the area was performed using a large                            amount, resulting in incomplete clearance with fair                            visualization.                           Internal hemorrhoids were found.                           The exam was otherwise without abnormality on                            direct and retroflexion views.                           see clinic consult note. generalized abdominal wall                            pain and tenderness since prior surgery                           Opiod-induced constipation. Complications:            No immediate complications. Estimated Blood Loss:     Estimated blood loss was minimal. Impression:               - Preparation of the colon was poor.                           - Four 3 to 8 mm polyps in the proximal transverse                            colon and in the ascending colon, removed with a                            cold snare. Resected and retrieved.                           - Stool in the entire examined colon.                           - Internal hemorrhoids.                           - The examination was otherwise normal on direct  and retroflexion views. Recommendation:           - Patient has a contact number available for                             emergencies. The signs and symptoms of potential                            delayed complications were discussed with the                            patient. Return to normal activities tomorrow.                            Written discharge instructions were provided to the                            patient.                           - Resume previous diet.                           - Continue present medications, including Movantik                           - Await pathology results.                           - Repeat colonoscopy in 1 year for surveillance.                            For that exam, 2 day prep with golytely both days Mallie Mussel L. Loletha Carrow, MD 01/25/2021 12:04:05 PM This report has been signed electronically.

## 2021-01-25 NOTE — Progress Notes (Signed)
History and Physical:  This patient presents for endoscopic testing for: Encounter Diagnoses  Name Primary?   Esophageal dysphagia Yes   Generalized abdominal pain    Chronic constipation    Barrett's esophagus without dysplasia     Patient denies chest pain or dyspnea. 11/24/20 office consult note with details Abd pain, constipation, dysphagia, chronic opioid therapy    Past Medical History: Past Medical History:  Diagnosis Date   Anxiety    Barrett's esophagus    Bipolar 1 disorder ([place])    Chronic GERD    Depression    Diabetes mellitus without complication (HCC)    Fibromyalgia    Hypercholesterolemia    Hypertension    IBS (irritable bowel syndrome)    IBS (irritable bowel syndrome)    Osteoporosis      Past Surgical History: Past Surgical History:  Procedure Laterality Date   CHOLECYSTECTOMY     HERNIA REPAIR  X2   TOTAL ABDOMINAL HYSTERECTOMY      Allergies: Allergies  Allergen Reactions   Latex Dermatitis   Penicillins Nausea And Vomiting, Rash and Other (See Comments)    Has patient had a PCN reaction causing immediate rash, facial/tongue/throat swelling, SOB or lightheadedness with hypotension: yes Has patient had a PCN reaction causing severe rash involving mucus membranes or skin necrosis: no Has patient had a PCN reaction that required hospitalization: no Has patient had a PCN reaction occurring within the last 10 years: yes If all of the above answers are "NO", then may proceed with Cephalosporin use.     Tape Rash and Other (See Comments)    needs paper tape     Outpatient Meds: Current Outpatient Medications  Medication Sig Dispense Refill   Buprenorphine HCl-Naloxone HCl 8-2 MG FILM Place 2 Film under the tongue daily.     Cholecalciferol (VITAMIN D) 50 MCG (2000 UT) CAPS Take 5 capsules by mouth daily.     diazepam (VALIUM) 10 MG tablet Take 5-10 mg by mouth 2 (two) times daily as needed for anxiety.      fenofibrate 160 MG tablet  daily.     gabapentin (NEURONTIN) 800 MG tablet Take 800-1,600 tablets by mouth See admin instructions. Take 1 tablet by mouth 3 times daily before meals & take 2 tablets by mouth at bedtime     glimepiride (AMARYL) 4 MG tablet Take 4 mg by mouth 2 (two) times daily.  5   PARoxetine (PAXIL) 30 MG tablet Take 60 mg by mouth at bedtime.      dicyclomine (BENTYL) 20 MG tablet Take 1 tablet (20 mg total) by mouth 2 (two) times daily. (Patient not taking: No sig reported) 20 tablet 0   esomeprazole (NEXIUM) 40 MG capsule Take 40 mg by mouth daily.      ferrous sulfate 325 (65 FE) MG tablet Take 325 mg by mouth daily with breakfast.     fluticasone (FLONASE) 50 MCG/ACT nasal spray Place 2 sprays into both nostrils daily as needed for allergies. For allergies      furosemide (LASIX) 40 MG tablet Take 40 mg by mouth as needed.     lamoTRIgine (LAMICTAL) 25 MG tablet Take 25 mg by mouth 3 (three) times daily. (Patient not taking: No sig reported)  0   meloxicam (MOBIC) 15 MG tablet Take by mouth daily. (Patient not taking: Reported on 01/25/2021)     metFORMIN (GLUCOPHAGE) 500 MG tablet Take 1,000-1,500 tablets See admin instructions by mouth. Take 3 tablets by mouth each morning  and 2 tablets by mouth each evening.     naloxegol oxalate (MOVANTIK) 12.5 MG TABS tablet Take 1 tablet (12.5 mg total) by mouth daily. 6 tablet 0   naloxegol oxalate (MOVANTIK) 25 MG TABS tablet Take 1 tablet (25 mg total) by mouth daily. 6 tablet 0   ondansetron (ZOFRAN ODT) 4 MG disintegrating tablet Take 1 tablet (4 mg total) by mouth every 8 (eight) hours as needed for nausea or vomiting. (Patient not taking: No sig reported) 15 tablet 0   rosuvastatin (CRESTOR) 5 MG tablet  (Patient not taking: Reported on 01/25/2021)     vitamin C (ASCORBIC ACID) 500 MG tablet Take 500 mg by mouth daily. (Patient not taking: Reported on 01/25/2021)     Current Facility-Administered Medications  Medication Dose Route Frequency Provider Last  Rate Last Admin   0.9 %  sodium chloride infusion  500 mL Intravenous Once Doran Stabler, MD          ___________________________________________________________________ Objective   Exam:  BP 140/78   Pulse 76   Temp (!) 97.5 F (36.4 C)   Resp 18   Ht 5\' 3"  (1.6 m)   Wt 142 lb (64.4 kg)   SpO2 100%   BMI 25.15 kg/m   CV: RRR without murmur, S1/S2 Resp: clear to auscultation bilaterally, normal RR and effort noted GI: soft, scattered tenderness as before, with active bowel sounds.   Assessment: Encounter Diagnoses  Name Primary?   Esophageal dysphagia Yes   Generalized abdominal pain    Chronic constipation    Barrett's esophagus without dysplasia      Plan: Colonoscopy EGD   The patient is appropriate for an endoscopic procedure in the ambulatory setting.   - Wilfrid Lund, MD

## 2021-01-25 NOTE — Op Note (Signed)
Cassie Cuevas: Cassie Cuevas Procedure Date: 01/25/2021 10:57 AM MRN: [id number] Endoscopist: Mallie Mussel L. Loletha Carrow , MD Age: 65 Referring MD:  Date of Birth: 28-Jul-1955 Gender: Female Account #: [id number] Procedure:                Upper GI endoscopy Indications:              Generalized abdominal pain, Esophageal dysphagia                           reported (but undocumented) history of Barrett's                            esophagus                           patient on chronic opioid therapy and NSAID for                            fibromyalgia Medicines:                Monitored Anesthesia Care Procedure:                Pre-Anesthesia Assessment:                           - Prior to the procedure, a History and Physical                            was performed, and patient medications and                            allergies were reviewed. The patient's tolerance of                            previous anesthesia was also reviewed. The risks                            and benefits of the procedure and the sedation                            options and risks were discussed with the patient.                            All questions were answered, and informed consent                            was obtained. Prior Anticoagulants: The patient has                            taken no previous anticoagulant or antiplatelet                            agents. ASA Grade Assessment: II - A patient with  mild systemic disease. After reviewing the risks                            and benefits, the patient was deemed in                            satisfactory condition to undergo the procedure.                           After obtaining informed consent, the endoscope was                            passed under direct vision. Throughout the                            procedure, the patient's blood pressure, pulse, and                            oxygen  saturations were monitored continuously. The                            Endoscope was introduced through the mouth, and                            advanced to the second part of duodenum. The upper                            GI endoscopy was accomplished without difficulty.                            The patient tolerated the procedure well. Scope In: Scope Out: Findings:                 The esophagus was normal. Specifically, no evidence                            of Barrett's esophagus or stricture.                           Multiple erosions with stigmata of recent bleeding                            were found in the gastric fundus and in the gastric                            antrum. Multiple biopsies were obtained on the                            greater curvature of the gastric body, on the                            lesser curvature of the gastric body, on the  greater curvature of the gastric antrum and on the                            lesser curvature of the gastric antrum with cold                            forceps for histology.                           The exam of the stomach was otherwise normal,                            including on retroflexion.                           Multiple diffuse erosions without bleeding were                            found in the duodenal bulb and in the second                            portion of the duodenum.                           The exam of the duodenum was otherwise normal.                           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 Complications:            No immediate complications. Estimated Blood Loss:     Estimated blood loss was minimal. Impression:               - Normal esophagus.                           - Gastric erosions with stigmata of recent bleeding.                           - Erosive duodenopathy without bleeding.                           - Multiple biopsies were obtained  on the greater                            curvature of the gastric body, on the lesser                            curvature of the gastric body, on the greater                            curvature of the gastric antrum and on the lesser                            curvature of the gastric antrum. Recommendation:           - Patient has a contact  number available for                            emergencies. The signs and symptoms of potential                            delayed complications were discussed with the                            patient. Return to normal activities tomorrow.                            Written discharge instructions were provided to the                            patient.                           - Resume previous diet.                           - Continue present medications, including                            esomeprazole daily. Decrease use of NSAID if                            possible                           - Await pathology results.                           - See the other procedure note for documentation of                            additional recommendations. Kalli Greenfield L. Loletha Carrow, MD 01/25/2021 11:58:39 AM This report has been signed electronically.

## 2021-01-25 NOTE — Progress Notes (Signed)
Called to room to assist during endoscopic procedure.  Patient ID and intended procedure confirmed with present staff. Received instructions for my participation in the procedure from the performing physician.  

## 2021-01-25 NOTE — Progress Notes (Signed)
Pt's states no medical or surgical changes since previsit or office visit. 

## 2021-01-25 NOTE — Progress Notes (Signed)
Pt in recovery with monitors in place, VSS. Report given to receiving RN. Bite guard was placed with pt awake to ensure comfort. No dental or soft tissue damage noted. 

## 2021-01-27 ENCOUNTER — Telehealth: Payer: Self-pay | Admitting: *Deleted

## 2021-01-27 NOTE — Telephone Encounter (Signed)
  Follow up Call-  Call back number 01/25/2021  Post procedure Call Back phone  # [phone]  Permission to leave phone message Yes  Some recent data might be hidden     Patient questions:  Do you have a fever, pain , or abdominal swelling? No. Pain Score  0 *  Have you tolerated food without any problems? Yes.    Have you been able to return to your normal activities? Yes.    Do you have any questions about your discharge instructions: Diet   No. Medications  No. Follow up visit  No.  Do you have questions or concerns about your Care? No.  Actions: * If pain score is 4 or above: No action needed, pain <4.Have you developed a fever since your procedure? no  2.   Have you had an respiratory symptoms (SOB or cough) since your procedure? no  3.   Have you tested positive for COVID 19 since your procedure no  4.   Have you had any family members/close contacts diagnosed with the COVID 19 since your procedure?  no   If yes to any of these questions please route to Joylene John, RN and Joella Prince, RN

## 2021-01-31 ENCOUNTER — Encounter: Payer: Self-pay | Admitting: Gastroenterology

## 2021-02-24 ENCOUNTER — Other Ambulatory Visit: Payer: Self-pay | Admitting: Family

## 2021-04-19 ENCOUNTER — Telehealth: Payer: Self-pay | Admitting: Gastroenterology

## 2021-04-19 NOTE — Telephone Encounter (Signed)
Left voicemail for pt to call back.

## 2021-04-19 NOTE — Telephone Encounter (Signed)
Patient called states she is experiencing a lot of burning in her stomach and is very nauseous.

## 2021-04-20 ENCOUNTER — Other Ambulatory Visit: Payer: Self-pay | Admitting: Family

## 2021-04-22 NOTE — Telephone Encounter (Signed)
Left message for pt to call back  °

## 2021-04-26 NOTE — Telephone Encounter (Signed)
No return call received from patient. Will await further communication from patient. 

## 2021-06-08 ENCOUNTER — Encounter: Payer: Self-pay | Admitting: Gastroenterology

## 2021-06-08 ENCOUNTER — Ambulatory Visit: Payer: Medicare HMO | Admitting: Gastroenterology

## 2021-06-08 VITALS — BP 102/64 | HR 88 | Ht 63.0 in | Wt 149.0 lb

## 2021-06-08 DIAGNOSIS — R1319 Other dysphagia: Secondary | ICD-10-CM

## 2021-06-08 DIAGNOSIS — R1084 Generalized abdominal pain: Secondary | ICD-10-CM | POA: Diagnosis not present

## 2021-06-08 DIAGNOSIS — K5909 Other constipation: Secondary | ICD-10-CM

## 2021-06-08 MED ORDER — NALOXEGOL OXALATE 25 MG PO TABS: 25.0000 mg | ORAL_TABLET | Freq: Every day | ORAL | 2 refills | Status: AC

## 2021-06-08 NOTE — Progress Notes (Signed)
[facility] Progress Note  Chief Complaint: Abdominal pain constipation and dysphagia  Subjective  History: Office consult August 2022  -lengthy note outlines her chronic history previous testing and treatment by surgery in [place].  She has generalized chronic abdominal pain and protuberance that she says happened after abdominal wall hernia.  She has chronic constipation and feelings of impaction.  Patient has chronic pain syndrome on opioids. On 01/25/2021 she had EGD and colonoscopy EGD with NSAID induced gastric and duodenal erosions, biopsy negative for H. pylori. Colonoscopy with multiple adenomatous polyps, poor preparation despite a large amount of lavage during procedure as well as a 2-day Suprep/GoLytely prep.  1 year recall recommended.  Nilza symptoms remain essentially unchanged.  She has feelings of food and liquid stuck in the chest at times and she has to wait for them to pass.  Generalized abdominal pain, bloating, intermittent nausea, chronic constipation.  She does not recall if she took the samples of Movantik or if they helped when I first saw her in August. He sometimes has to disimpact himself and feels as if there is a "cavity" stool is getting trapped.  If she strains she feels as if there may be tissue protruding.  ROS: Cardiovascular:  no chest pain Respiratory: no dyspnea  The patient's Past Medical, Family and Social History were reviewed and are on file in the EMR.  Objective:  Med list reviewed  Current Outpatient Medications:    Buprenorphine HCl-Naloxone HCl 8-2 MG FILM, Place 2 Film under the tongue daily., Disp: , Rfl:    Cholecalciferol (VITAMIN D) 50 MCG (2000 UT) CAPS, Take 5 capsules by mouth daily., Disp: , Rfl:    diazepam (VALIUM) 10 MG tablet, Take 5-10 mg by mouth 2 (two) times daily as needed for anxiety. , Disp: , Rfl:    dicyclomine (BENTYL) 20 MG tablet, Take 1 tablet (20 mg total) by mouth 2 (two) times daily., Disp: 20 tablet,  Rfl: 0   esomeprazole (NEXIUM) 40 MG capsule, Take 40 mg by mouth daily. , Disp: , Rfl:    fenofibrate 160 MG tablet, daily., Disp: , Rfl:    ferrous sulfate 325 (65 FE) MG tablet, Take 325 mg by mouth daily with breakfast., Disp: , Rfl:    fluticasone (FLONASE) 50 MCG/ACT nasal spray, Place 2 sprays into both nostrils daily as needed for allergies. For allergies , Disp: , Rfl:    furosemide (LASIX) 40 MG tablet, Take 40 mg by mouth as needed., Disp: , Rfl:    glimepiride (AMARYL) 4 MG tablet, Take 4 mg by mouth 2 (two) times daily., Disp: , Rfl: 5   lamoTRIgine (LAMICTAL) 25 MG tablet, Take 25 mg by mouth 3 (three) times daily., Disp: , Rfl: 0   meloxicam (MOBIC) 15 MG tablet, Take by mouth daily., Disp: , Rfl:    metFORMIN (GLUCOPHAGE) 500 MG tablet, Take 1,000-1,500 tablets See admin instructions by mouth. Take 3 tablets by mouth each morning and 2 tablets by mouth each evening., Disp: , Rfl:    naloxegol oxalate (MOVANTIK) 25 MG TABS tablet, Take 1 tablet (25 mg total) by mouth daily., Disp: 30 tablet, Rfl: 2   ondansetron (ZOFRAN ODT) 4 MG disintegrating tablet, Take 1 tablet (4 mg total) by mouth every 8 (eight) hours as needed for nausea or vomiting., Disp: 15 tablet, Rfl: 0   PARoxetine (PAXIL) 30 MG tablet, Take 60 mg by mouth at bedtime. , Disp: , Rfl:    pregabalin (LYRICA) 100  MG capsule, Take 100 mg by mouth 3 (three) times daily., Disp: , Rfl:    rosuvastatin (CRESTOR) 5 MG tablet, , Disp: , Rfl:    vitamin C (ASCORBIC ACID) 500 MG tablet, Take 500 mg by mouth daily., Disp: , Rfl:   Current Facility-Administered Medications:    0.9 %  sodium chloride infusion, 500 mL, Intravenous, Once, Danis, Estill Cotta III, MD   Vital signs in last 24 hrs: Vitals:   06/08/21 1427  BP: 102/64  Pulse: 88   Wt Readings from Last 3 Encounters:  06/08/21 149 lb (67.6 kg)  01/25/21 142 lb (64.4 kg)  11/24/20 142 lb (64.4 kg)    Physical Exam   Cardiac: RRR without murmurs, S1S2 heard, no  peripheral edema Pulm: clear to auscultation bilaterally, normal RR and effort noted Abdomen: soft, scattered tenderness to light palpation abdominal wall, with active bowel sounds. No guarding or palpable hepatosplenomegaly. Skin; warm and dry, no jaundice or rash  Labs:   ___________________________________________ Radiologic studies:   ____________________________________________ Other: 1. Surgical [P], gastric antrum and gastric body - UNREMARKABLE ANTRAL AND OXYNTIC MUCOSA. [place] NEGATIVE FOR HELICOBACTER PYLORI. - NO INTESTINAL METAPLASIA, DYSPLASIA OR CARCINOMA. 2. Surgical [P], colon, ascending, transverse, polyp (4) - TUBULAR ADENOMA (4) - NO HIGH GRADE DYSPLASIA OR CARCINOMA.  _____________________________________________ Assessment & Plan  Assessment: Encounter Diagnoses  Name Primary?   Generalized abdominal pain Yes   Chronic constipation    Esophageal dysphagia    Generalized abdominal pain that she attributes to an abdominal wall hernia repair.  She has chronic constipation that I feel is largely related to opioid use.  Dysphagia that appears to be a motility problem without structural abnormalities or dilatation seen on EGD. If reports being told by her gynecologist in the past that she had pelvic adhesions from endometriosis, perhaps there is some of that contributing to her constipation.  Which she also describes indicates possible rectocele.  No mucosal changes of prolapse seen on recent colonoscopy.  Unfortunate the preparation was poor due to her severe constipation.  Plan: I recommend she see her gynecologist and have an exam to check for possible rectocele  Movantik 25 mg once a day, MiraLAX 1 capful daily, Dulcolax suppository every 2 to 3 days as needed  Repeat colonoscopy about October 2023, at which time she will need a 2-day prep with 4 L of GoLytely both days.  Nelida Meuse III

## 2021-06-08 NOTE — Patient Instructions (Signed)
If you are age 64 or older, your body mass index should be between 23-30. Your Body mass index is 26.39 kg/m. If this is out of the aforementioned range listed, please consider follow up with your Primary Care Provider.  If you are age 19 or younger, your body mass index should be between 19-25. Your Body mass index is 26.39 kg/m. If this is out of the aformentioned range listed, please consider follow up with your Primary Care Provider.   ________________________________________________________  The [facility] providers would like to encourage you to use [facility] to communicate with providers for non-urgent requests or questions.  Due to long hold times on the telephone, sending your provider a message by [facility] may be a faster and more efficient way to get a response.  Please allow 48 business hours for a response.  Please remember that this is for non-urgent requests.  _______________________________________________________  Miralax one capful a day. Movantik 25mg  daily. Dulcolax suppository.    It was a pleasure to see you today!  Thank you for trusting me with your gastrointestinal care!

## 2021-08-23 DIAGNOSIS — F319 Bipolar disorder, unspecified: Secondary | ICD-10-CM | POA: Diagnosis not present

## 2021-08-23 DIAGNOSIS — Z6825 Body mass index (BMI) 25.0-25.9, adult: Secondary | ICD-10-CM | POA: Diagnosis not present

## 2021-08-23 DIAGNOSIS — F1120 Opioid dependence, uncomplicated: Secondary | ICD-10-CM | POA: Diagnosis not present

## 2021-08-23 DIAGNOSIS — F19980 Other psychoactive substance use, unspecified with psychoactive substance-induced anxiety disorder: Secondary | ICD-10-CM | POA: Diagnosis not present

## 2021-09-06 DIAGNOSIS — F1120 Opioid dependence, uncomplicated: Secondary | ICD-10-CM | POA: Diagnosis not present

## 2021-09-06 DIAGNOSIS — F19980 Other psychoactive substance use, unspecified with psychoactive substance-induced anxiety disorder: Secondary | ICD-10-CM | POA: Diagnosis not present

## 2021-09-06 DIAGNOSIS — Z79899 Other long term (current) drug therapy: Secondary | ICD-10-CM | POA: Diagnosis not present

## 2021-09-06 DIAGNOSIS — Z6825 Body mass index (BMI) 25.0-25.9, adult: Secondary | ICD-10-CM | POA: Diagnosis not present

## 2021-09-06 DIAGNOSIS — F319 Bipolar disorder, unspecified: Secondary | ICD-10-CM | POA: Diagnosis not present

## 2021-09-08 DIAGNOSIS — F19980 Other psychoactive substance use, unspecified with psychoactive substance-induced anxiety disorder: Secondary | ICD-10-CM | POA: Diagnosis not present

## 2021-09-08 DIAGNOSIS — M159 Polyosteoarthritis, unspecified: Secondary | ICD-10-CM | POA: Diagnosis not present

## 2021-09-08 DIAGNOSIS — J309 Allergic rhinitis, unspecified: Secondary | ICD-10-CM | POA: Diagnosis not present

## 2021-09-08 DIAGNOSIS — R110 Nausea: Secondary | ICD-10-CM | POA: Diagnosis not present

## 2021-09-08 DIAGNOSIS — E785 Hyperlipidemia, unspecified: Secondary | ICD-10-CM | POA: Diagnosis not present

## 2021-09-08 DIAGNOSIS — I10 Essential (primary) hypertension: Secondary | ICD-10-CM | POA: Diagnosis not present

## 2021-09-08 DIAGNOSIS — E1142 Type 2 diabetes mellitus with diabetic polyneuropathy: Secondary | ICD-10-CM | POA: Diagnosis not present

## 2021-09-08 DIAGNOSIS — F3341 Major depressive disorder, recurrent, in partial remission: Secondary | ICD-10-CM | POA: Diagnosis not present

## 2021-09-08 DIAGNOSIS — E1140 Type 2 diabetes mellitus with diabetic neuropathy, unspecified: Secondary | ICD-10-CM | POA: Diagnosis not present

## 2021-09-27 DIAGNOSIS — E10621 Type 1 diabetes mellitus with foot ulcer: Secondary | ICD-10-CM | POA: Diagnosis not present

## 2021-09-27 DIAGNOSIS — L97418 Non-pressure chronic ulcer of right heel and midfoot with other specified severity: Secondary | ICD-10-CM | POA: Diagnosis not present

## 2021-09-27 DIAGNOSIS — F319 Bipolar disorder, unspecified: Secondary | ICD-10-CM | POA: Diagnosis not present

## 2021-09-27 DIAGNOSIS — L97419 Non-pressure chronic ulcer of right heel and midfoot with unspecified severity: Secondary | ICD-10-CM | POA: Diagnosis not present

## 2021-09-27 DIAGNOSIS — Z794 Long term (current) use of insulin: Secondary | ICD-10-CM | POA: Diagnosis not present

## 2021-09-27 DIAGNOSIS — Z6825 Body mass index (BMI) 25.0-25.9, adult: Secondary | ICD-10-CM | POA: Diagnosis not present

## 2021-09-27 DIAGNOSIS — L97428 Non-pressure chronic ulcer of left heel and midfoot with other specified severity: Secondary | ICD-10-CM | POA: Diagnosis not present

## 2021-09-27 DIAGNOSIS — F1120 Opioid dependence, uncomplicated: Secondary | ICD-10-CM | POA: Diagnosis not present

## 2021-09-27 DIAGNOSIS — L97429 Non-pressure chronic ulcer of left heel and midfoot with unspecified severity: Secondary | ICD-10-CM | POA: Diagnosis not present

## 2021-09-27 DIAGNOSIS — F19980 Other psychoactive substance use, unspecified with psychoactive substance-induced anxiety disorder: Secondary | ICD-10-CM | POA: Diagnosis not present

## 2021-09-28 DIAGNOSIS — F19980 Other psychoactive substance use, unspecified with psychoactive substance-induced anxiety disorder: Secondary | ICD-10-CM | POA: Diagnosis not present

## 2021-09-28 DIAGNOSIS — J45909 Unspecified asthma, uncomplicated: Secondary | ICD-10-CM | POA: Diagnosis not present

## 2021-09-28 DIAGNOSIS — E1142 Type 2 diabetes mellitus with diabetic polyneuropathy: Secondary | ICD-10-CM | POA: Diagnosis not present

## 2021-09-28 DIAGNOSIS — E785 Hyperlipidemia, unspecified: Secondary | ICD-10-CM | POA: Diagnosis not present

## 2021-09-28 DIAGNOSIS — M159 Polyosteoarthritis, unspecified: Secondary | ICD-10-CM | POA: Diagnosis not present

## 2021-09-28 DIAGNOSIS — E1140 Type 2 diabetes mellitus with diabetic neuropathy, unspecified: Secondary | ICD-10-CM | POA: Diagnosis not present

## 2021-09-28 DIAGNOSIS — R110 Nausea: Secondary | ICD-10-CM | POA: Diagnosis not present

## 2021-09-28 DIAGNOSIS — F3341 Major depressive disorder, recurrent, in partial remission: Secondary | ICD-10-CM | POA: Diagnosis not present

## 2021-09-28 DIAGNOSIS — I10 Essential (primary) hypertension: Secondary | ICD-10-CM | POA: Diagnosis not present

## 2021-10-05 DIAGNOSIS — R110 Nausea: Secondary | ICD-10-CM | POA: Diagnosis not present

## 2021-10-05 DIAGNOSIS — E1140 Type 2 diabetes mellitus with diabetic neuropathy, unspecified: Secondary | ICD-10-CM | POA: Diagnosis not present

## 2021-10-05 DIAGNOSIS — F17210 Nicotine dependence, cigarettes, uncomplicated: Secondary | ICD-10-CM | POA: Diagnosis not present

## 2021-10-05 DIAGNOSIS — E1142 Type 2 diabetes mellitus with diabetic polyneuropathy: Secondary | ICD-10-CM | POA: Diagnosis not present

## 2021-10-05 DIAGNOSIS — L97429 Non-pressure chronic ulcer of left heel and midfoot with unspecified severity: Secondary | ICD-10-CM | POA: Diagnosis not present

## 2021-10-05 DIAGNOSIS — F19980 Other psychoactive substance use, unspecified with psychoactive substance-induced anxiety disorder: Secondary | ICD-10-CM | POA: Diagnosis not present

## 2021-10-05 DIAGNOSIS — M159 Polyosteoarthritis, unspecified: Secondary | ICD-10-CM | POA: Diagnosis not present

## 2021-10-05 DIAGNOSIS — E11621 Type 2 diabetes mellitus with foot ulcer: Secondary | ICD-10-CM | POA: Diagnosis not present

## 2021-10-05 DIAGNOSIS — J309 Allergic rhinitis, unspecified: Secondary | ICD-10-CM | POA: Diagnosis not present

## 2021-10-05 DIAGNOSIS — F3341 Major depressive disorder, recurrent, in partial remission: Secondary | ICD-10-CM | POA: Diagnosis not present

## 2021-10-05 DIAGNOSIS — I10 Essential (primary) hypertension: Secondary | ICD-10-CM | POA: Diagnosis not present

## 2021-10-05 DIAGNOSIS — E785 Hyperlipidemia, unspecified: Secondary | ICD-10-CM | POA: Diagnosis not present

## 2021-10-05 DIAGNOSIS — E1165 Type 2 diabetes mellitus with hyperglycemia: Secondary | ICD-10-CM | POA: Diagnosis not present

## 2021-10-05 DIAGNOSIS — L97419 Non-pressure chronic ulcer of right heel and midfoot with unspecified severity: Secondary | ICD-10-CM | POA: Diagnosis not present

## 2021-10-10 DIAGNOSIS — F319 Bipolar disorder, unspecified: Secondary | ICD-10-CM | POA: Diagnosis not present

## 2021-10-10 DIAGNOSIS — F19980 Other psychoactive substance use, unspecified with psychoactive substance-induced anxiety disorder: Secondary | ICD-10-CM | POA: Diagnosis not present

## 2021-10-10 DIAGNOSIS — F1120 Opioid dependence, uncomplicated: Secondary | ICD-10-CM | POA: Diagnosis not present

## 2021-10-10 DIAGNOSIS — Z6825 Body mass index (BMI) 25.0-25.9, adult: Secondary | ICD-10-CM | POA: Diagnosis not present

## 2021-10-10 DIAGNOSIS — Z79899 Other long term (current) drug therapy: Secondary | ICD-10-CM | POA: Diagnosis not present

## 2021-10-12 DIAGNOSIS — L97429 Non-pressure chronic ulcer of left heel and midfoot with unspecified severity: Secondary | ICD-10-CM | POA: Diagnosis not present

## 2021-10-12 DIAGNOSIS — E11621 Type 2 diabetes mellitus with foot ulcer: Secondary | ICD-10-CM | POA: Diagnosis not present

## 2021-10-12 DIAGNOSIS — L97419 Non-pressure chronic ulcer of right heel and midfoot with unspecified severity: Secondary | ICD-10-CM | POA: Diagnosis not present

## 2021-10-12 DIAGNOSIS — E1165 Type 2 diabetes mellitus with hyperglycemia: Secondary | ICD-10-CM | POA: Diagnosis not present

## 2021-10-12 DIAGNOSIS — F17210 Nicotine dependence, cigarettes, uncomplicated: Secondary | ICD-10-CM | POA: Diagnosis not present

## 2021-10-14 DIAGNOSIS — I10 Essential (primary) hypertension: Secondary | ICD-10-CM | POA: Diagnosis not present

## 2021-10-14 DIAGNOSIS — F3341 Major depressive disorder, recurrent, in partial remission: Secondary | ICD-10-CM | POA: Diagnosis not present

## 2021-10-14 DIAGNOSIS — R110 Nausea: Secondary | ICD-10-CM | POA: Diagnosis not present

## 2021-10-14 DIAGNOSIS — J309 Allergic rhinitis, unspecified: Secondary | ICD-10-CM | POA: Diagnosis not present

## 2021-10-14 DIAGNOSIS — E1140 Type 2 diabetes mellitus with diabetic neuropathy, unspecified: Secondary | ICD-10-CM | POA: Diagnosis not present

## 2021-10-14 DIAGNOSIS — E785 Hyperlipidemia, unspecified: Secondary | ICD-10-CM | POA: Diagnosis not present

## 2021-10-14 DIAGNOSIS — E1142 Type 2 diabetes mellitus with diabetic polyneuropathy: Secondary | ICD-10-CM | POA: Diagnosis not present

## 2021-10-14 DIAGNOSIS — M159 Polyosteoarthritis, unspecified: Secondary | ICD-10-CM | POA: Diagnosis not present

## 2021-10-14 DIAGNOSIS — F19980 Other psychoactive substance use, unspecified with psychoactive substance-induced anxiety disorder: Secondary | ICD-10-CM | POA: Diagnosis not present

## 2021-10-20 DIAGNOSIS — E1140 Type 2 diabetes mellitus with diabetic neuropathy, unspecified: Secondary | ICD-10-CM | POA: Diagnosis not present

## 2021-10-20 DIAGNOSIS — M159 Polyosteoarthritis, unspecified: Secondary | ICD-10-CM | POA: Diagnosis not present

## 2021-10-20 DIAGNOSIS — R110 Nausea: Secondary | ICD-10-CM | POA: Diagnosis not present

## 2021-10-20 DIAGNOSIS — E1142 Type 2 diabetes mellitus with diabetic polyneuropathy: Secondary | ICD-10-CM | POA: Diagnosis not present

## 2021-10-20 DIAGNOSIS — E785 Hyperlipidemia, unspecified: Secondary | ICD-10-CM | POA: Diagnosis not present

## 2021-10-20 DIAGNOSIS — F19980 Other psychoactive substance use, unspecified with psychoactive substance-induced anxiety disorder: Secondary | ICD-10-CM | POA: Diagnosis not present

## 2021-10-20 DIAGNOSIS — F3341 Major depressive disorder, recurrent, in partial remission: Secondary | ICD-10-CM | POA: Diagnosis not present

## 2021-10-20 DIAGNOSIS — J309 Allergic rhinitis, unspecified: Secondary | ICD-10-CM | POA: Diagnosis not present

## 2021-10-20 DIAGNOSIS — I10 Essential (primary) hypertension: Secondary | ICD-10-CM | POA: Diagnosis not present

## 2021-10-21 DIAGNOSIS — E1142 Type 2 diabetes mellitus with diabetic polyneuropathy: Secondary | ICD-10-CM | POA: Diagnosis not present

## 2021-10-21 DIAGNOSIS — E785 Hyperlipidemia, unspecified: Secondary | ICD-10-CM | POA: Diagnosis not present

## 2021-10-21 DIAGNOSIS — R110 Nausea: Secondary | ICD-10-CM | POA: Diagnosis not present

## 2021-10-21 DIAGNOSIS — I10 Essential (primary) hypertension: Secondary | ICD-10-CM | POA: Diagnosis not present

## 2021-10-21 DIAGNOSIS — J309 Allergic rhinitis, unspecified: Secondary | ICD-10-CM | POA: Diagnosis not present

## 2021-10-21 DIAGNOSIS — F19980 Other psychoactive substance use, unspecified with psychoactive substance-induced anxiety disorder: Secondary | ICD-10-CM | POA: Diagnosis not present

## 2021-10-21 DIAGNOSIS — E1140 Type 2 diabetes mellitus with diabetic neuropathy, unspecified: Secondary | ICD-10-CM | POA: Diagnosis not present

## 2021-10-21 DIAGNOSIS — M159 Polyosteoarthritis, unspecified: Secondary | ICD-10-CM | POA: Diagnosis not present

## 2021-10-21 DIAGNOSIS — F3341 Major depressive disorder, recurrent, in partial remission: Secondary | ICD-10-CM | POA: Diagnosis not present

## 2021-10-27 DIAGNOSIS — F19980 Other psychoactive substance use, unspecified with psychoactive substance-induced anxiety disorder: Secondary | ICD-10-CM | POA: Diagnosis not present

## 2021-10-27 DIAGNOSIS — Z6825 Body mass index (BMI) 25.0-25.9, adult: Secondary | ICD-10-CM | POA: Diagnosis not present

## 2021-10-27 DIAGNOSIS — F1120 Opioid dependence, uncomplicated: Secondary | ICD-10-CM | POA: Diagnosis not present

## 2021-10-27 DIAGNOSIS — Z79899 Other long term (current) drug therapy: Secondary | ICD-10-CM | POA: Diagnosis not present

## 2021-10-27 DIAGNOSIS — F319 Bipolar disorder, unspecified: Secondary | ICD-10-CM | POA: Diagnosis not present

## 2021-11-02 DIAGNOSIS — E785 Hyperlipidemia, unspecified: Secondary | ICD-10-CM | POA: Diagnosis not present

## 2021-11-02 DIAGNOSIS — L97429 Non-pressure chronic ulcer of left heel and midfoot with unspecified severity: Secondary | ICD-10-CM | POA: Diagnosis not present

## 2021-11-02 DIAGNOSIS — I10 Essential (primary) hypertension: Secondary | ICD-10-CM | POA: Diagnosis not present

## 2021-11-02 DIAGNOSIS — R110 Nausea: Secondary | ICD-10-CM | POA: Diagnosis not present

## 2021-11-02 DIAGNOSIS — E11621 Type 2 diabetes mellitus with foot ulcer: Secondary | ICD-10-CM | POA: Diagnosis not present

## 2021-11-02 DIAGNOSIS — F0390 Unspecified dementia without behavioral disturbance: Secondary | ICD-10-CM | POA: Diagnosis not present

## 2021-11-02 DIAGNOSIS — J309 Allergic rhinitis, unspecified: Secondary | ICD-10-CM | POA: Diagnosis not present

## 2021-11-02 DIAGNOSIS — L97419 Non-pressure chronic ulcer of right heel and midfoot with unspecified severity: Secondary | ICD-10-CM | POA: Diagnosis not present

## 2021-11-02 DIAGNOSIS — E1140 Type 2 diabetes mellitus with diabetic neuropathy, unspecified: Secondary | ICD-10-CM | POA: Diagnosis not present

## 2021-11-02 DIAGNOSIS — F3341 Major depressive disorder, recurrent, in partial remission: Secondary | ICD-10-CM | POA: Diagnosis not present

## 2021-11-02 DIAGNOSIS — M159 Polyosteoarthritis, unspecified: Secondary | ICD-10-CM | POA: Diagnosis not present

## 2021-11-02 DIAGNOSIS — E1142 Type 2 diabetes mellitus with diabetic polyneuropathy: Secondary | ICD-10-CM | POA: Diagnosis not present

## 2021-11-09 DIAGNOSIS — Z6824 Body mass index (BMI) 24.0-24.9, adult: Secondary | ICD-10-CM | POA: Diagnosis not present

## 2021-11-09 DIAGNOSIS — F1120 Opioid dependence, uncomplicated: Secondary | ICD-10-CM | POA: Diagnosis not present

## 2021-11-09 DIAGNOSIS — F319 Bipolar disorder, unspecified: Secondary | ICD-10-CM | POA: Diagnosis not present

## 2021-11-09 DIAGNOSIS — F19980 Other psychoactive substance use, unspecified with psychoactive substance-induced anxiety disorder: Secondary | ICD-10-CM | POA: Diagnosis not present

## 2021-11-23 DIAGNOSIS — F319 Bipolar disorder, unspecified: Secondary | ICD-10-CM | POA: Diagnosis not present

## 2021-11-23 DIAGNOSIS — Z79899 Other long term (current) drug therapy: Secondary | ICD-10-CM | POA: Diagnosis not present

## 2021-11-23 DIAGNOSIS — F19980 Other psychoactive substance use, unspecified with psychoactive substance-induced anxiety disorder: Secondary | ICD-10-CM | POA: Diagnosis not present

## 2021-11-23 DIAGNOSIS — Z6824 Body mass index (BMI) 24.0-24.9, adult: Secondary | ICD-10-CM | POA: Diagnosis not present

## 2021-11-23 DIAGNOSIS — F1120 Opioid dependence, uncomplicated: Secondary | ICD-10-CM | POA: Diagnosis not present

## 2021-11-30 DIAGNOSIS — J309 Allergic rhinitis, unspecified: Secondary | ICD-10-CM | POA: Diagnosis not present

## 2021-11-30 DIAGNOSIS — R7989 Other specified abnormal findings of blood chemistry: Secondary | ICD-10-CM | POA: Diagnosis not present

## 2021-11-30 DIAGNOSIS — I10 Essential (primary) hypertension: Secondary | ICD-10-CM | POA: Diagnosis not present

## 2021-11-30 DIAGNOSIS — F0390 Unspecified dementia without behavioral disturbance: Secondary | ICD-10-CM | POA: Diagnosis not present

## 2021-11-30 DIAGNOSIS — R110 Nausea: Secondary | ICD-10-CM | POA: Diagnosis not present

## 2021-11-30 DIAGNOSIS — E785 Hyperlipidemia, unspecified: Secondary | ICD-10-CM | POA: Diagnosis not present

## 2021-11-30 DIAGNOSIS — E1142 Type 2 diabetes mellitus with diabetic polyneuropathy: Secondary | ICD-10-CM | POA: Diagnosis not present

## 2021-11-30 DIAGNOSIS — M159 Polyosteoarthritis, unspecified: Secondary | ICD-10-CM | POA: Diagnosis not present

## 2021-11-30 DIAGNOSIS — E1140 Type 2 diabetes mellitus with diabetic neuropathy, unspecified: Secondary | ICD-10-CM | POA: Diagnosis not present

## 2021-12-08 DIAGNOSIS — Z6824 Body mass index (BMI) 24.0-24.9, adult: Secondary | ICD-10-CM | POA: Diagnosis not present

## 2021-12-08 DIAGNOSIS — F319 Bipolar disorder, unspecified: Secondary | ICD-10-CM | POA: Diagnosis not present

## 2021-12-08 DIAGNOSIS — F1120 Opioid dependence, uncomplicated: Secondary | ICD-10-CM | POA: Diagnosis not present

## 2021-12-08 DIAGNOSIS — Z79899 Other long term (current) drug therapy: Secondary | ICD-10-CM | POA: Diagnosis not present

## 2021-12-13 DIAGNOSIS — R457 State of emotional shock and stress, unspecified: Secondary | ICD-10-CM | POA: Diagnosis not present

## 2021-12-13 DIAGNOSIS — I10 Essential (primary) hypertension: Secondary | ICD-10-CM | POA: Diagnosis not present

## 2021-12-13 DIAGNOSIS — E119 Type 2 diabetes mellitus without complications: Secondary | ICD-10-CM | POA: Diagnosis not present

## 2021-12-13 DIAGNOSIS — F17210 Nicotine dependence, cigarettes, uncomplicated: Secondary | ICD-10-CM | POA: Diagnosis not present

## 2021-12-13 DIAGNOSIS — E7800 Pure hypercholesterolemia, unspecified: Secondary | ICD-10-CM | POA: Diagnosis not present

## 2021-12-13 DIAGNOSIS — F319 Bipolar disorder, unspecified: Secondary | ICD-10-CM | POA: Diagnosis not present

## 2021-12-13 DIAGNOSIS — J45909 Unspecified asthma, uncomplicated: Secondary | ICD-10-CM | POA: Diagnosis not present

## 2021-12-13 DIAGNOSIS — R45851 Suicidal ideations: Secondary | ICD-10-CM | POA: Diagnosis not present

## 2021-12-13 DIAGNOSIS — F32A Depression, unspecified: Secondary | ICD-10-CM | POA: Diagnosis not present

## 2021-12-13 DIAGNOSIS — M109 Gout, unspecified: Secondary | ICD-10-CM | POA: Diagnosis not present

## 2021-12-13 DIAGNOSIS — M1990 Unspecified osteoarthritis, unspecified site: Secondary | ICD-10-CM | POA: Diagnosis not present

## 2021-12-14 ENCOUNTER — Other Ambulatory Visit: Payer: Self-pay | Admitting: Family

## 2021-12-22 DIAGNOSIS — M159 Polyosteoarthritis, unspecified: Secondary | ICD-10-CM | POA: Diagnosis not present

## 2021-12-22 DIAGNOSIS — I10 Essential (primary) hypertension: Secondary | ICD-10-CM | POA: Diagnosis not present

## 2021-12-22 DIAGNOSIS — F0390 Unspecified dementia without behavioral disturbance: Secondary | ICD-10-CM | POA: Diagnosis not present

## 2021-12-22 DIAGNOSIS — F1920 Other psychoactive substance dependence, uncomplicated: Secondary | ICD-10-CM | POA: Diagnosis not present

## 2021-12-22 DIAGNOSIS — R5382 Chronic fatigue, unspecified: Secondary | ICD-10-CM | POA: Diagnosis not present

## 2021-12-22 DIAGNOSIS — F1120 Opioid dependence, uncomplicated: Secondary | ICD-10-CM | POA: Diagnosis not present

## 2021-12-22 DIAGNOSIS — J45909 Unspecified asthma, uncomplicated: Secondary | ICD-10-CM | POA: Diagnosis not present

## 2021-12-22 DIAGNOSIS — F19980 Other psychoactive substance use, unspecified with psychoactive substance-induced anxiety disorder: Secondary | ICD-10-CM | POA: Diagnosis not present

## 2021-12-22 DIAGNOSIS — J309 Allergic rhinitis, unspecified: Secondary | ICD-10-CM | POA: Diagnosis not present

## 2022-02-14 ENCOUNTER — Encounter: Payer: Self-pay | Admitting: Gastroenterology

## 2022-03-06 DIAGNOSIS — I10 Essential (primary) hypertension: Secondary | ICD-10-CM | POA: Diagnosis not present

## 2022-03-06 DIAGNOSIS — J309 Allergic rhinitis, unspecified: Secondary | ICD-10-CM | POA: Diagnosis not present

## 2022-03-06 DIAGNOSIS — R110 Nausea: Secondary | ICD-10-CM | POA: Diagnosis not present

## 2022-03-06 DIAGNOSIS — F19980 Other psychoactive substance use, unspecified with psychoactive substance-induced anxiety disorder: Secondary | ICD-10-CM | POA: Diagnosis not present

## 2022-03-06 DIAGNOSIS — F0390 Unspecified dementia without behavioral disturbance: Secondary | ICD-10-CM | POA: Diagnosis not present

## 2022-03-06 DIAGNOSIS — J45909 Unspecified asthma, uncomplicated: Secondary | ICD-10-CM | POA: Diagnosis not present

## 2022-03-06 DIAGNOSIS — F1120 Opioid dependence, uncomplicated: Secondary | ICD-10-CM | POA: Diagnosis not present

## 2022-03-06 DIAGNOSIS — F1920 Other psychoactive substance dependence, uncomplicated: Secondary | ICD-10-CM | POA: Diagnosis not present

## 2022-03-06 DIAGNOSIS — M159 Polyosteoarthritis, unspecified: Secondary | ICD-10-CM | POA: Diagnosis not present

## 2022-03-08 DIAGNOSIS — S62647A Nondisplaced fracture of proximal phalanx of left little finger, initial encounter for closed fracture: Secondary | ICD-10-CM | POA: Diagnosis not present

## 2022-03-21 DIAGNOSIS — F1120 Opioid dependence, uncomplicated: Secondary | ICD-10-CM | POA: Diagnosis not present

## 2022-03-21 DIAGNOSIS — J309 Allergic rhinitis, unspecified: Secondary | ICD-10-CM | POA: Diagnosis not present

## 2022-03-21 DIAGNOSIS — J45909 Unspecified asthma, uncomplicated: Secondary | ICD-10-CM | POA: Diagnosis not present

## 2022-03-21 DIAGNOSIS — E1142 Type 2 diabetes mellitus with diabetic polyneuropathy: Secondary | ICD-10-CM | POA: Diagnosis not present

## 2022-03-21 DIAGNOSIS — R110 Nausea: Secondary | ICD-10-CM | POA: Diagnosis not present

## 2022-03-21 DIAGNOSIS — F1920 Other psychoactive substance dependence, uncomplicated: Secondary | ICD-10-CM | POA: Diagnosis not present

## 2022-03-21 DIAGNOSIS — F0390 Unspecified dementia without behavioral disturbance: Secondary | ICD-10-CM | POA: Diagnosis not present

## 2022-03-21 DIAGNOSIS — M159 Polyosteoarthritis, unspecified: Secondary | ICD-10-CM | POA: Diagnosis not present

## 2022-03-21 DIAGNOSIS — F19980 Other psychoactive substance use, unspecified with psychoactive substance-induced anxiety disorder: Secondary | ICD-10-CM | POA: Diagnosis not present

## 2022-03-21 DIAGNOSIS — I10 Essential (primary) hypertension: Secondary | ICD-10-CM | POA: Diagnosis not present

## 2022-04-04 DIAGNOSIS — I10 Essential (primary) hypertension: Secondary | ICD-10-CM | POA: Diagnosis not present

## 2022-04-04 DIAGNOSIS — F19980 Other psychoactive substance use, unspecified with psychoactive substance-induced anxiety disorder: Secondary | ICD-10-CM | POA: Diagnosis not present

## 2022-04-04 DIAGNOSIS — J45909 Unspecified asthma, uncomplicated: Secondary | ICD-10-CM | POA: Diagnosis not present

## 2022-04-04 DIAGNOSIS — F1120 Opioid dependence, uncomplicated: Secondary | ICD-10-CM | POA: Diagnosis not present

## 2022-04-04 DIAGNOSIS — F0390 Unspecified dementia without behavioral disturbance: Secondary | ICD-10-CM | POA: Diagnosis not present

## 2022-04-04 DIAGNOSIS — M159 Polyosteoarthritis, unspecified: Secondary | ICD-10-CM | POA: Diagnosis not present

## 2022-04-04 DIAGNOSIS — R110 Nausea: Secondary | ICD-10-CM | POA: Diagnosis not present

## 2022-04-04 DIAGNOSIS — F1920 Other psychoactive substance dependence, uncomplicated: Secondary | ICD-10-CM | POA: Diagnosis not present

## 2022-04-04 DIAGNOSIS — J309 Allergic rhinitis, unspecified: Secondary | ICD-10-CM | POA: Diagnosis not present

## 2022-04-18 DIAGNOSIS — M159 Polyosteoarthritis, unspecified: Secondary | ICD-10-CM | POA: Diagnosis not present

## 2022-04-18 DIAGNOSIS — R110 Nausea: Secondary | ICD-10-CM | POA: Diagnosis not present

## 2022-04-18 DIAGNOSIS — F19980 Other psychoactive substance use, unspecified with psychoactive substance-induced anxiety disorder: Secondary | ICD-10-CM | POA: Diagnosis not present

## 2022-04-18 DIAGNOSIS — I10 Essential (primary) hypertension: Secondary | ICD-10-CM | POA: Diagnosis not present

## 2022-04-18 DIAGNOSIS — J45909 Unspecified asthma, uncomplicated: Secondary | ICD-10-CM | POA: Diagnosis not present

## 2022-04-18 DIAGNOSIS — F0390 Unspecified dementia without behavioral disturbance: Secondary | ICD-10-CM | POA: Diagnosis not present

## 2022-04-18 DIAGNOSIS — J309 Allergic rhinitis, unspecified: Secondary | ICD-10-CM | POA: Diagnosis not present

## 2022-04-18 DIAGNOSIS — F1920 Other psychoactive substance dependence, uncomplicated: Secondary | ICD-10-CM | POA: Diagnosis not present

## 2022-04-18 DIAGNOSIS — F1120 Opioid dependence, uncomplicated: Secondary | ICD-10-CM | POA: Diagnosis not present

## 2023-03-14 ENCOUNTER — Telehealth: Payer: Self-pay | Admitting: Gastroenterology

## 2023-03-14 NOTE — Telephone Encounter (Signed)
Inbound call from patient requesting an appointment for recall colonoscopy. Patient states she is also having complications with constipation and states she has to take a laxative everyday. Patient states she prefers not to come in for an office prior to colonoscopy due to being over due for colonoscopy and states it is hard for her to receive transportation. Patient is requesting a call to be advised further. Please advise, thank you.

## 2023-03-14 NOTE — Telephone Encounter (Signed)
Left message for pt to call back  °

## 2023-03-16 NOTE — Telephone Encounter (Signed)
PT returning call. Please advise.

## 2023-03-16 NOTE — Telephone Encounter (Signed)
Left message for patient to call back  

## 2023-03-20 NOTE — Telephone Encounter (Signed)
Left message for patient to call back  

## 2023-03-21 NOTE — Telephone Encounter (Signed)
Left message for pt to call back  °

## 2023-03-27 NOTE — Telephone Encounter (Signed)
Left message for pt to call back  After multiple unsuccessful attempts to reach pt by phone, a letter was created and sent to pt via mail.

## 2023-06-18 ENCOUNTER — Other Ambulatory Visit: Payer: Self-pay | Admitting: Gastroenterology

## 2023-06-18 DIAGNOSIS — K824 Cholesterolosis of gallbladder: Secondary | ICD-10-CM

## 2023-06-22 ENCOUNTER — Ambulatory Visit: Payer: Medicare (Managed Care)

## 2023-11-21 ENCOUNTER — Telehealth: Payer: Self-pay | Admitting: Neurology

## 2023-11-21 ENCOUNTER — Ambulatory Visit: Admitting: Neurology

## 2023-11-21 NOTE — Telephone Encounter (Signed)
 Appointment r/s due to pt not feeling well

## 2024-03-03 ENCOUNTER — Ambulatory Visit: Admitting: Neurology

## 2024-03-12 ENCOUNTER — Other Ambulatory Visit (HOSPITAL_BASED_OUTPATIENT_CLINIC_OR_DEPARTMENT_OTHER): Payer: Self-pay | Admitting: Nurse Practitioner

## 2024-03-12 DIAGNOSIS — K760 Fatty (change of) liver, not elsewhere classified: Secondary | ICD-10-CM
